# Patient Record
Sex: Female | Born: 1970 | Race: White | Hispanic: No | Marital: Married | Smoking: Current every day smoker
Health system: Southern US, Community
[De-identification: ages and names within clinical notes are randomized; demographics above are authoritative.]

## PROBLEM LIST (undated history)

## (undated) DIAGNOSIS — E119 Type 2 diabetes mellitus without complications: Secondary | ICD-10-CM

## (undated) DIAGNOSIS — Z789 Other specified health status: Secondary | ICD-10-CM

## (undated) DIAGNOSIS — N390 Urinary tract infection, site not specified: Secondary | ICD-10-CM

## (undated) HISTORY — PX: ABDOMINAL HYSTERECTOMY: SHX81

---

## 2005-03-02 ENCOUNTER — Emergency Department: Payer: Self-pay | Admitting: Emergency Medicine

## 2005-04-28 ENCOUNTER — Emergency Department: Payer: Self-pay | Admitting: Emergency Medicine

## 2005-06-19 ENCOUNTER — Emergency Department: Payer: Self-pay | Admitting: Unknown Physician Specialty

## 2005-09-28 ENCOUNTER — Emergency Department: Payer: Self-pay | Admitting: General Practice

## 2006-09-06 ENCOUNTER — Emergency Department: Payer: Self-pay | Admitting: Emergency Medicine

## 2007-07-20 ENCOUNTER — Emergency Department: Payer: Self-pay | Admitting: Emergency Medicine

## 2008-03-11 ENCOUNTER — Emergency Department: Payer: Self-pay | Admitting: Emergency Medicine

## 2008-10-21 ENCOUNTER — Emergency Department: Payer: Self-pay | Admitting: Emergency Medicine

## 2009-03-10 ENCOUNTER — Emergency Department: Payer: Self-pay | Admitting: Emergency Medicine

## 2009-03-13 ENCOUNTER — Emergency Department: Payer: Self-pay | Admitting: Unknown Physician Specialty

## 2009-08-06 ENCOUNTER — Emergency Department: Payer: Self-pay | Admitting: Unknown Physician Specialty

## 2010-05-19 ENCOUNTER — Emergency Department: Payer: Self-pay | Admitting: Emergency Medicine

## 2012-03-16 ENCOUNTER — Emergency Department: Payer: Self-pay | Admitting: Emergency Medicine

## 2012-03-16 LAB — URINALYSIS, COMPLETE
Bilirubin,UR: NEGATIVE
Glucose,UR: NEGATIVE mg/dL (ref 0–75)
Ketone: NEGATIVE
Protein: 100
RBC,UR: 29 /HPF (ref 0–5)
Squamous Epithelial: 10
WBC UR: 1005 /HPF (ref 0–5)

## 2012-03-20 LAB — URINE CULTURE

## 2012-11-18 ENCOUNTER — Emergency Department: Payer: Self-pay | Admitting: Emergency Medicine

## 2012-11-18 LAB — URINALYSIS, COMPLETE
Bilirubin,UR: NEGATIVE
Glucose,UR: NEGATIVE mg/dL (ref 0–75)
Ketone: NEGATIVE
Nitrite: POSITIVE
Ph: 5 (ref 4.5–8.0)
Protein: 100
RBC,UR: NONE SEEN /HPF (ref 0–5)
Specific Gravity: 1.017 (ref 1.003–1.030)
Squamous Epithelial: 14
WBC UR: 19748 /HPF (ref 0–5)

## 2012-11-21 LAB — URINE CULTURE

## 2012-12-26 ENCOUNTER — Emergency Department: Payer: Self-pay | Admitting: Unknown Physician Specialty

## 2012-12-26 LAB — CBC
HCT: 39.1 % (ref 35.0–47.0)
HGB: 13 g/dL (ref 12.0–16.0)
MCH: 30.2 pg (ref 26.0–34.0)
MCV: 91 fL (ref 80–100)
RDW: 14.5 % (ref 11.5–14.5)
WBC: 12.5 10*3/uL — ABNORMAL HIGH (ref 3.6–11.0)

## 2012-12-26 LAB — COMPREHENSIVE METABOLIC PANEL
Anion Gap: 10 (ref 7–16)
BUN: 20 mg/dL — ABNORMAL HIGH (ref 7–18)
Chloride: 105 mmol/L (ref 98–107)
EGFR (African American): >60
Glucose: 125 mg/dL — ABNORMAL HIGH (ref 65–99)
Potassium: 3.9 mmol/L (ref 3.5–5.1)
Sodium: 134 mmol/L — ABNORMAL LOW (ref 136–145)
Total Protein: 8.9 g/dL — ABNORMAL HIGH (ref 6.4–8.2)

## 2013-04-23 ENCOUNTER — Emergency Department: Payer: Self-pay | Admitting: Emergency Medicine

## 2013-04-23 LAB — COMPREHENSIVE METABOLIC PANEL
Albumin: 3.9 g/dL (ref 3.4–5.0)
Anion Gap: 7 (ref 7–16)
BUN: 14 mg/dL (ref 7–18)
Bilirubin,Total: 0.2 mg/dL (ref 0.2–1.0)
Calcium, Total: 9.6 mg/dL (ref 8.5–10.1)
Chloride: 106 mmol/L (ref 98–107)
Co2: 26 mmol/L (ref 21–32)
Potassium: 3.6 mmol/L (ref 3.5–5.1)
SGPT (ALT): 19 U/L (ref 12–78)
Total Protein: 8.2 g/dL (ref 6.4–8.2)

## 2013-04-23 LAB — URINALYSIS, COMPLETE
Glucose,UR: NEGATIVE mg/dL (ref 0–75)
Ketone: NEGATIVE
Ph: 6 (ref 4.5–8.0)
RBC,UR: 381 /HPF (ref 0–5)
Specific Gravity: 1.016 (ref 1.003–1.030)

## 2013-04-23 LAB — CBC
HCT: 39.3 % (ref 35.0–47.0)
MCH: 29.3 pg (ref 26.0–34.0)
MCV: 89 fL (ref 80–100)
RBC: 4.43 10*6/uL (ref 3.80–5.20)
RDW: 14.2 % (ref 11.5–14.5)

## 2013-06-23 ENCOUNTER — Emergency Department: Payer: Self-pay | Admitting: Emergency Medicine

## 2013-06-23 LAB — URINALYSIS, COMPLETE
Bilirubin,UR: NEGATIVE
Ketone: NEGATIVE
Ph: 6 (ref 4.5–8.0)
Protein: 100
RBC,UR: 173 /HPF (ref 0–5)
Specific Gravity: 1.013 (ref 1.003–1.030)
Squamous Epithelial: 33
WBC UR: 5009 /HPF (ref 0–5)

## 2013-06-25 LAB — URINE CULTURE

## 2013-10-18 ENCOUNTER — Emergency Department: Payer: Self-pay | Admitting: Emergency Medicine

## 2013-10-18 LAB — CBC
HCT: 40.9 % (ref 35.0–47.0)
HGB: 13.9 g/dL (ref 12.0–16.0)
MCH: 30.1 pg (ref 26.0–34.0)
MCHC: 33.9 g/dL (ref 32.0–36.0)
MCV: 89 fL (ref 80–100)
Platelet: 282 10*3/uL (ref 150–440)
RBC: 4.61 10*6/uL (ref 3.80–5.20)
RDW: 14.2 % (ref 11.5–14.5)
WBC: 15.1 10*3/uL — ABNORMAL HIGH (ref 3.6–11.0)

## 2013-10-18 LAB — URINALYSIS, COMPLETE
Bacteria: NONE SEEN
Bilirubin,UR: NEGATIVE
Glucose,UR: NEGATIVE mg/dL (ref 0–75)
Ketone: NEGATIVE
Nitrite: NEGATIVE
PH: 7 (ref 4.5–8.0)
Protein: 100
RBC,UR: 81 /HPF (ref 0–5)
Specific Gravity: 1.012 (ref 1.003–1.030)

## 2013-10-18 LAB — COMPREHENSIVE METABOLIC PANEL
ALT: 20 U/L (ref 12–78)
Albumin: 3.4 g/dL (ref 3.4–5.0)
Alkaline Phosphatase: 130 U/L — ABNORMAL HIGH
Anion Gap: 5 — ABNORMAL LOW (ref 7–16)
BUN: 11 mg/dL (ref 7–18)
Bilirubin,Total: 0.3 mg/dL (ref 0.2–1.0)
Calcium, Total: 9.1 mg/dL (ref 8.5–10.1)
Chloride: 105 mmol/L (ref 98–107)
Co2: 25 mmol/L (ref 21–32)
Creatinine: 0.74 mg/dL (ref 0.60–1.30)
EGFR (African American): >60
EGFR (Non-African Amer.): >60
Glucose: 155 mg/dL — ABNORMAL HIGH (ref 65–99)
OSMOLALITY: 273 (ref 275–301)
Potassium: 3.4 mmol/L — ABNORMAL LOW (ref 3.5–5.1)
SGOT(AST): 13 U/L — ABNORMAL LOW (ref 15–37)
SODIUM: 135 mmol/L — AB (ref 136–145)
Total Protein: 7.8 g/dL (ref 6.4–8.2)

## 2013-10-20 LAB — URINE CULTURE

## 2013-12-27 ENCOUNTER — Emergency Department: Payer: Self-pay | Admitting: Internal Medicine

## 2013-12-27 LAB — URINALYSIS, COMPLETE
BILIRUBIN, UR: NEGATIVE
Glucose,UR: NEGATIVE mg/dL (ref 0–75)
Ketone: NEGATIVE
NITRITE: POSITIVE
Ph: 6 (ref 4.5–8.0)
Specific Gravity: 1.01 (ref 1.003–1.030)
Squamous Epithelial: 4
WBC UR: 2369 /HPF (ref 0–5)

## 2013-12-27 LAB — GC/CHLAMYDIA PROBE AMP

## 2013-12-27 LAB — WET PREP, GENITAL

## 2013-12-29 LAB — URINE CULTURE

## 2014-05-18 ENCOUNTER — Emergency Department: Payer: Self-pay | Admitting: Emergency Medicine

## 2014-05-18 LAB — URINALYSIS, COMPLETE
BLOOD: NEGATIVE
Bilirubin,UR: NEGATIVE
Glucose,UR: NEGATIVE mg/dL (ref 0–75)
Nitrite: NEGATIVE
Ph: 7 (ref 4.5–8.0)
SQUAMOUS EPITHELIAL: NONE SEEN
Specific Gravity: 1.015 (ref 1.003–1.030)

## 2014-07-09 ENCOUNTER — Emergency Department: Payer: Self-pay | Admitting: Emergency Medicine

## 2014-07-09 LAB — CBC WITH DIFFERENTIAL/PLATELET
BASOS PCT: 0.9 %
Basophil #: 0.1 10*3/uL (ref 0.0–0.1)
EOS PCT: 0 %
Eosinophil #: 0 10*3/uL (ref 0.0–0.7)
HCT: 41.2 % (ref 35.0–47.0)
HGB: 13.1 g/dL (ref 12.0–16.0)
LYMPHS PCT: 8.7 %
Lymphocyte #: 1.4 10*3/uL (ref 1.0–3.6)
MCH: 29.5 pg (ref 26.0–34.0)
MCHC: 31.7 g/dL — ABNORMAL LOW (ref 32.0–36.0)
MCV: 93 fL (ref 80–100)
MONO ABS: 1 x10 3/mm — AB (ref 0.2–0.9)
Monocyte %: 6.3 %
NEUTROS PCT: 84.1 %
Neutrophil #: 13.9 10*3/uL — ABNORMAL HIGH (ref 1.4–6.5)
PLATELETS: 271 10*3/uL (ref 150–440)
RBC: 4.42 10*6/uL (ref 3.80–5.20)
RDW: 13.9 % (ref 11.5–14.5)
WBC: 16.6 10*3/uL — ABNORMAL HIGH (ref 3.6–11.0)

## 2014-07-09 LAB — URINALYSIS, COMPLETE
Bilirubin,UR: NEGATIVE
Glucose,UR: NEGATIVE mg/dL (ref 0–75)
Ketone: NEGATIVE
Nitrite: NEGATIVE
PH: 7 (ref 4.5–8.0)
Protein: >=500
RBC,UR: 906 /HPF (ref 0–5)
Specific Gravity: 1.016 (ref 1.003–1.030)
Squamous Epithelial: 6
Transitional Epi: 6
WBC UR: 11808 /HPF (ref 0–5)

## 2014-07-09 LAB — COMPREHENSIVE METABOLIC PANEL
ALBUMIN: 3.7 g/dL (ref 3.4–5.0)
ANION GAP: 7 (ref 7–16)
Alkaline Phosphatase: 132 U/L — ABNORMAL HIGH
BUN: 13 mg/dL (ref 7–18)
Bilirubin,Total: 0.2 mg/dL (ref 0.2–1.0)
CALCIUM: 9.1 mg/dL (ref 8.5–10.1)
CO2: 25 mmol/L (ref 21–32)
Chloride: 109 mmol/L — ABNORMAL HIGH (ref 98–107)
Creatinine: 0.73 mg/dL (ref 0.60–1.30)
Glucose: 130 mg/dL — ABNORMAL HIGH (ref 65–99)
Osmolality: 283 (ref 275–301)
Potassium: 4 mmol/L (ref 3.5–5.1)
SGOT(AST): 15 U/L (ref 15–37)
SGPT (ALT): 18 U/L
Sodium: 141 mmol/L (ref 136–145)
TOTAL PROTEIN: 7.9 g/dL (ref 6.4–8.2)

## 2015-01-21 ENCOUNTER — Emergency Department
Admit: 2015-01-21 | Disposition: A | Discharge status: Home / Self Care | Payer: Self-pay | Admitting: Emergency Medicine

## 2015-01-21 LAB — COMPREHENSIVE METABOLIC PANEL
ALBUMIN: 3.9 g/dL
ANION GAP: 10 (ref 7–16)
AST: 16 U/L
Alkaline Phosphatase: 118 U/L
BUN: 12 mg/dL
Bilirubin,Total: 0.3 mg/dL
CALCIUM: 9.2 mg/dL
CHLORIDE: 101 mmol/L
CO2: 27 mmol/L
Creatinine: 0.66 mg/dL
GLUCOSE: 140 mg/dL — AB
Potassium: 3.3 mmol/L — ABNORMAL LOW
SGPT (ALT): 13 U/L — ABNORMAL LOW
Sodium: 138 mmol/L
Total Protein: 7.9 g/dL

## 2015-01-21 LAB — CBC WITH DIFFERENTIAL/PLATELET
BASOS ABS: 0.1 10*3/uL (ref 0.0–0.1)
Basophil %: 1.3 %
Eosinophil #: 0.1 10*3/uL (ref 0.0–0.7)
Eosinophil %: 0.7 %
HCT: 38.1 % (ref 35.0–47.0)
HGB: 12.4 g/dL (ref 12.0–16.0)
LYMPHS ABS: 3.3 10*3/uL (ref 1.0–3.6)
LYMPHS PCT: 29.4 %
MCH: 29.5 pg (ref 26.0–34.0)
MCHC: 32.7 g/dL (ref 32.0–36.0)
MCV: 90 fL (ref 80–100)
Monocyte #: 0.8 x10 3/mm (ref 0.2–0.9)
Monocyte %: 7.3 %
Neutrophil #: 6.8 10*3/uL — ABNORMAL HIGH (ref 1.4–6.5)
Neutrophil %: 61.3 %
PLATELETS: 292 10*3/uL (ref 150–440)
RBC: 4.22 10*6/uL (ref 3.80–5.20)
RDW: 14.3 % (ref 11.5–14.5)
WBC: 11 10*3/uL (ref 3.6–11.0)

## 2015-01-21 LAB — URINALYSIS, COMPLETE
BILIRUBIN, UR: NEGATIVE
GLUCOSE, UR: NEGATIVE mg/dL (ref 0–75)
Ketone: NEGATIVE
Nitrite: NEGATIVE
Ph: 6 (ref 4.5–8.0)
Protein: 100
SQUAMOUS EPITHELIAL: NONE SEEN
Specific Gravity: 1.015 (ref 1.003–1.030)

## 2015-09-30 ENCOUNTER — Emergency Department
Admission: EM | Admit: 2015-09-30 | Discharge: 2015-09-30 | Disposition: A | Discharge status: Home / Self Care | Payer: Commercial Managed Care - HMO | Attending: Emergency Medicine | Admitting: Emergency Medicine

## 2015-09-30 DIAGNOSIS — N390 Urinary tract infection, site not specified: Secondary | ICD-10-CM

## 2015-09-30 DIAGNOSIS — H6501 Acute serous otitis media, right ear: Secondary | ICD-10-CM | POA: Diagnosis not present

## 2015-09-30 DIAGNOSIS — R350 Frequency of micturition: Secondary | ICD-10-CM | POA: Diagnosis present

## 2015-09-30 LAB — URINALYSIS COMPLETE WITH MICROSCOPIC (ARMC ONLY)
BILIRUBIN URINE: NEGATIVE
GLUCOSE, UA: NEGATIVE mg/dL
Ketones, ur: NEGATIVE mg/dL
NITRITE: NEGATIVE
Protein, ur: 100 mg/dL — AB
Specific Gravity, Urine: 1.017 (ref 1.005–1.030)
pH: 6 (ref 5.0–8.0)

## 2015-09-30 MED ORDER — CIPROFLOXACIN HCL 500 MG PO TABS: 500.0000 mg | ORAL_TABLET | Freq: Two times a day (BID) | ORAL | Status: DC

## 2015-09-30 MED ORDER — CETIRIZINE HCL 10 MG PO TABS: 10.0000 mg | ORAL_TABLET | Freq: Every day | ORAL | Status: DC

## 2015-09-30 MED ORDER — PHENAZOPYRIDINE HCL 95 MG PO TABS: 95.0000 mg | ORAL_TABLET | Freq: Three times a day (TID) | ORAL | Status: DC | PRN

## 2015-09-30 MED ORDER — FLUTICASONE PROPIONATE 50 MCG/ACT NA SUSP: 1.0000 | Freq: Two times a day (BID) | NASAL | Status: DC

## 2015-09-30 NOTE — Discharge Instructions (Signed)
Antibiotic Medicine °Antibiotic medicines are used to treat infections caused by bacteria. They work by injuring or killing the bacteria that is making you sick. °HOW IS AN ANTIBIOTIC CHOSEN? °An antibiotic is chosen based on many factors. To help your health care provider choose one for you, tell your health care provider if: °· You have any allergies. °· You are pregnant or plan to get pregnant. °· You are breastfeeding. °· You are taking any medicines. These include over-the-counter medicines, prescription medicines, and herbal remedies. °· You have a medical condition or problem you have not already discussed. °Your health care provider will also consider: °· How often the medicine has to be taken. °· Common side effects of the medicine. °· The cost of the medicine. °· The taste of the medicine. °If you have questions about why an antibiotic was chosen, make sure to ask. °FOR HOW LONG SHOULD I TAKE MY ANTIBIOTIC? °Continue to take your antibiotic for as long as told by your health care provider. Do not stop taking it when you feel better. If you stop taking it too soon: °· You may start to feel sick again. °· Your infection may become harder to treat. °· Complications may develop. °WHAT IF I MISS A DOSE? °Try not to miss any doses of medicine. If you miss a dose, take it as soon as possible. However, if it is almost time for the next dose: °· If you are taking 2 doses per day, take the missed dose and the next dose 5 to 6 hours apart. °· If you are taking 3 or more doses per day, take the missed dose and the next dose 2 to 4 hours apart, then go back to the normal schedule. °If you cannot make up a missed dose, take the next scheduled dose on time. Then take the missed dose after you have taken all the doses as recommended by your health care provider, as if you had one more dose left. °DO ANTIBIOTICS AFFECT BIRTH CONTROL? °Birth control pills may not work while you are on antibiotics. If you are taking birth  control pills, continue taking them as usual and use a second form of birth control, such as a condom, to avoid unwanted pregnancy. Continue using the second form of birth control until you are finished with your current 1 month cycle of birth control pills. °OTHER INFORMATION °· If there is any medicine left over, throw it away. °· Never take someone else's antibiotics. °· Never take leftover antibiotics. °SEEK MEDICAL CARE IF: °· You get worse. °· You do not feel better within a few days of starting the antibiotic medicine. °· You vomit. °· White patches appear in your mouth. °· You have new joint pain that begins after starting the antibiotic. °· You have new muscle aches that begin after starting the antibiotic. °· You had a fever before starting the antibiotic and it returns. °· You have any symptoms of an allergic reaction, such as an itchy rash. If this happens, stop taking the antibiotic. °SEEK IMMEDIATE MEDICAL CARE IF: °· Your urine turns dark or becomes blood-colored. °· Your skin turns yellow. °· You bruise or bleed easily. °· You have severe diarrhea and abdominal cramps. °· You have a severe headache. °· You have signs of a severe allergic reaction, such as: °¨ Trouble breathing. °¨ Wheezing. °¨ Swelling of the lips, tongue, or face. °¨ Fainting. °¨ Blisters on the skin or in the mouth. °If you have signs of a severe allergic   reaction, stop taking the antibiotic right away.   This information is not intended to replace advice given to you by your health care provider. Make sure you discuss any questions you have with your health care provider.   Document Released: 05/30/2004 Document Revised: 06/08/2015 Document Reviewed: 02/02/2015 Elsevier Interactive Patient Education 2016 [employer]  Otitis Media, Adult Otitis media is redness, soreness, and inflammation of the middle ear. Otitis media may be caused by allergies or, most commonly, by infection. Often it occurs as a complication of the  common cold. SIGNS AND SYMPTOMS Symptoms of otitis media may include:  Earache.  Fever.  Ringing in your ear.  Headache.  Leakage of fluid from the ear. DIAGNOSIS To diagnose otitis media, your health care provider will examine your ear with an otoscope. This is an instrument that allows your health care provider to see into your ear in order to examine your eardrum. Your health care provider also will ask you questions about your symptoms. TREATMENT  Typically, otitis media resolves on its own within 3-5 days. Your health care provider may prescribe medicine to ease your symptoms of pain. If otitis media does not resolve within 5 days or is recurrent, your health care provider may prescribe antibiotic medicines if he or she suspects that a bacterial infection is the cause. HOME CARE INSTRUCTIONS   If you were prescribed an antibiotic medicine, finish it all even if you start to feel better.  Take medicines only as directed by your health care provider.  Keep all follow-up visits as directed by your health care provider. SEEK MEDICAL CARE IF:  You have otitis media only in one ear, or bleeding from your nose, or both.  You notice a lump on your neck.  You are not getting better in 3-5 days.  You feel worse instead of better. SEEK IMMEDIATE MEDICAL CARE IF:   You have pain that is not controlled with medicine.  You have swelling, redness, or pain around your ear or stiffness in your neck.  You notice that part of your face is paralyzed.  You notice that the bone behind your ear (mastoid) is tender when you touch it. MAKE SURE YOU:   Understand these instructions.  Will watch your condition.  Will get help right away if you are not doing well or get worse.   This information is not intended to replace advice given to you by your health care provider. Make sure you discuss any questions you have with your health care provider.   Document Released: 06/22/2004 Document  Revised: 10/08/2014 Document Reviewed: 04/14/2013 Elsevier Interactive Patient Education 2016 [employer]  Urinary Tract Infection Urinary tract infections (UTIs) can develop anywhere along your urinary tract. Your urinary tract is your body's drainage system for removing wastes and extra water. Your urinary tract includes two kidneys, two ureters, a bladder, and a urethra. Your kidneys are a pair of bean-shaped organs. Each kidney is about the size of your fist. They are located below your ribs, one on each side of your spine. CAUSES Infections are caused by microbes, which are microscopic organisms, including fungi, viruses, and bacteria. These organisms are so small that they can only be seen through a microscope. Bacteria are the microbes that most commonly cause UTIs. SYMPTOMS  Symptoms of UTIs may vary by age and gender of the patient and by the location of the infection. Symptoms in young women typically include a frequent and intense urge to urinate and a painful, burning feeling  in the bladder or urethra during urination. Older women and men are more likely to be tired, shaky, and weak and have muscle aches and abdominal pain. A fever may mean the infection is in your kidneys. Other symptoms of a kidney infection include pain in your back or sides below the ribs, nausea, and vomiting. DIAGNOSIS To diagnose a UTI, your caregiver will ask you about your symptoms. Your caregiver will also ask you to provide a urine sample. The urine sample will be tested for bacteria and white blood cells. White blood cells are made by your body to help fight infection. TREATMENT  Typically, UTIs can be treated with medication. Because most UTIs are caused by a bacterial infection, they usually can be treated with the use of antibiotics. The choice of antibiotic and length of treatment depend on your symptoms and the type of bacteria causing your infection. HOME CARE INSTRUCTIONS  If you were prescribed  antibiotics, take them exactly as your caregiver instructs you. Finish the medication even if you feel better after you have only taken some of the medication.  Drink enough water and fluids to keep your urine clear or pale yellow.  Avoid caffeine, tea, and carbonated beverages. They tend to irritate your bladder.  Empty your bladder often. Avoid holding urine for long periods of time.  Empty your bladder before and after sexual intercourse.  After a bowel movement, women should cleanse from front to back. Use each tissue only once. SEEK MEDICAL CARE IF:   You have back pain.  You develop a fever.  Your symptoms do not begin to resolve within 3 days. SEEK IMMEDIATE MEDICAL CARE IF:   You have severe back pain or lower abdominal pain.  You develop chills.  You have nausea or vomiting.  You have continued burning or discomfort with urination. MAKE SURE YOU:   Understand these instructions.  Will watch your condition.  Will get help right away if you are not doing well or get worse.   This information is not intended to replace advice given to you by your health care provider. Make sure you discuss any questions you have with your health care provider.   Document Released: 06/27/2005 Document Revised: 06/08/2015 Document Reviewed: 10/26/2011 Elsevier Interactive Patient Education [employer]

## 2015-09-30 NOTE — ED Provider Notes (Signed)
[facility] Emergency Department Provider Note ?  ? ____________________________________________ ? Time seen: 8:11 PM ? I have reviewed the triage vital signs and the nursing notes.  ________ HISTORY ? Chief Complaint Otalgia and Urinary Frequency     HPI  Susan Munoz is a 44 y.o. female   who presents emergency department complaining of 2 to three-week history of dysuria, polyuria, as well as a total day history of nasal congestion and right ear pain. Per the patient she has had multiple UTIs in the past and states the symptoms are consistent. She denies any flank pain. She denies any hematuria. Patient denies any sinus pressure but she does endorse some clear rhinorrhea as well as right-sided ear pain. ? ? ? No past medical history on file.  There are no active problems to display for this patient.  ? No past surgical history on file. ? Current Outpatient Rx  Name  Route  Sig  Dispense  Refill  . cetirizine (ZYRTEC) 10 MG tablet   Oral   Take 1 tablet (10 mg total) by mouth daily.   30 tablet   0   . ciprofloxacin (CIPRO) 500 MG tablet   Oral   Take 1 tablet (500 mg total) by mouth 2 (two) times daily.   14 tablet   0   . fluticasone (FLONASE) 50 MCG/ACT nasal spray   Each Nare   Place 1 spray into both nostrils 2 (two) times daily.   16 g   0   . phenazopyridine (PYRIDIUM) 95 MG tablet   Oral   Take 1 tablet (95 mg total) by mouth 3 (three) times daily as needed for pain.   6 tablet   0    ? Allergies Sulfa antibiotics ? No family history on file. ? Social History Social History  Substance Use Topics  . Smoking status: Not on file  . Smokeless tobacco: Not on file  . Alcohol Use: Not on file   ? Review of Systems Constitutional: no fever. Eyes: no discharge ENT: no sore throat. Patient endorses right-sided ear pain. Cardiovascular: no chest pain. Respiratory: no cough. No sob Gastrointestinal: denies abdominal  pain, vomiting, diarrhea, and constipation Genitourinary: Positive for dysuria and polyuria.. Negative for hematuria Musculoskeletal: Negative for back pain. Skin: Negative for rash. Neurological: Negative for headaches  10-point ROS otherwise negative.  _______________ PHYSICAL EXAM: ? VITAL SIGNS:   ED Triage Vitals  Enc Vitals Group     BP 09/30/15 1911 125/92 mmHg     Pulse --      Resp --      Temp 09/30/15 1911 98 F (36.7 C)     Temp Source 09/30/15 1911 Oral     SpO2 --      Weight --      Height --      Head Cir --      Peak Flow --      Pain Score 09/30/15 1911 5     Pain Loc --      Pain Edu? --      Excl. in GC? --    ?  Constitutional: Alert and oriented. Well appearing and in no distress. Eyes: Conjunctivae are normal.  ENT      Head: Normocephalic and atraumatic.      Ears: EACs are unremarkable bilaterally. TM on left is unremarkable. TM on right is visualized as dusky in appearance, moderately bulging, with air-fluid level.      Nose: Moderate  clear congestion/rhinnorhea.      Mouth/Throat: Mucous membranes are moist.   Hematological/Lymphatic/Immunilogical: No cervical lymphadenopathy. Cardiovascular: Normal rate, regular rhythm. Normal S1 and S2. Respiratory: Normal respiratory effort without tachypnea nor retractions. Lungs CTAB. Gastrointestinal: Soft and nontender. All sounds 4 quadrants. No guarding. No rigidity. No distention. There is no CVA tenderness. Genitourinary:  Musculoskeletal: Nontender with normal range of motion in all extremities.  Neurologic:  Normal speech and language. No gross focal neurologic deficits are appreciated. Skin:  Skin is warm, dry and intact. No rash noted. Psychiatric: Mood and affect are normal. Speech and behavior are normal. Patient exhibits appropriate insight and judgment.    LABS (all labs ordered are listed, but only abnormal results are displayed)  Labs Reviewed  URINALYSIS COMPLETEWITH MICROSCOPIC  ([facility] ONLY) - Abnormal; Notable for the following:    Color, Urine YELLOW (*)    APPearance TURBID (*)    Hgb urine dipstick 2+ (*)    Protein, ur 100 (*)    Leukocytes, UA 3+ (*)    Bacteria, UA MANY (*)    Squamous Epithelial / LPF 6-30 (*)    All other components within normal limits    ___________ RADIOLOGY    _____________ PROCEDURES ? Procedure(s) performed:    Medications - No data to display  ______________________________________________________ INITIAL IMPRESSION / ASSESSMENT AND PLAN / ED COURSE ? Pertinent labs & imaging results that were available during my care of the patient were reviewed by me and considered in my medical decision making (see chart for details).   His diagnosis is consistent with right-sided otitis media and urinary tract infection. Patient will be given antibiotics, Flonase, Zyrtec, Pyridium for symptom control. Patient is to follow-up with her primary care provider should symptoms persist past treatment course. Patient verbalizes understanding of diagnosis and verbalize compliance with the plan.     New Prescriptions   CETIRIZINE (ZYRTEC) 10 MG TABLET    Take 1 tablet (10 mg total) by mouth daily.   CIPROFLOXACIN (CIPRO) 500 MG TABLET    Take 1 tablet (500 mg total) by mouth 2 (two) times daily.   FLUTICASONE (FLONASE) 50 MCG/ACT NASAL SPRAY    Place 1 spray into both nostrils 2 (two) times daily.   PHENAZOPYRIDINE (PYRIDIUM) 95 MG TABLET    Take 1 tablet (95 mg total) by mouth 3 (three) times daily as needed for pain.   ____________________________________________ FINAL CLINICAL IMPRESSION(S) / ED DIAGNOSES?  Final diagnoses:  UTI (lower urinary tract infection)  Right acute serous otitis media, recurrence not specified            Racheal PatchesJonathan D Cuthriell, PA-C 09/30/15 2012  Phineas SemenGraydon Goodman, MD 09/30/15 2211

## 2015-09-30 NOTE — ED Notes (Signed)
AAOx3.  Skin warm and dry.  NAD.  D/C home 

## 2015-09-30 NOTE — ED Notes (Signed)
Pt reports several weeks of urinary frequency with pain and burning with urination. Pt also reports congestion for about 2 days and now having pain to her right ear.

## 2016-11-06 ENCOUNTER — Emergency Department
Admission: EM | Admit: 2016-11-06 | Discharge: 2016-11-06 | Disposition: A | Discharge status: Home / Self Care | Payer: Commercial Managed Care - HMO | Attending: Emergency Medicine | Admitting: Emergency Medicine

## 2016-11-06 ENCOUNTER — Encounter: Payer: Self-pay | Admitting: *Deleted

## 2016-11-06 DIAGNOSIS — N3001 Acute cystitis with hematuria: Secondary | ICD-10-CM | POA: Insufficient documentation

## 2016-11-06 DIAGNOSIS — F17200 Nicotine dependence, unspecified, uncomplicated: Secondary | ICD-10-CM | POA: Insufficient documentation

## 2016-11-06 DIAGNOSIS — E119 Type 2 diabetes mellitus without complications: Secondary | ICD-10-CM | POA: Insufficient documentation

## 2016-11-06 DIAGNOSIS — H9211 Otorrhea, right ear: Secondary | ICD-10-CM

## 2016-11-06 HISTORY — DX: Other specified health status: Z78.9

## 2016-11-06 HISTORY — DX: Type 2 diabetes mellitus without complications: E11.9

## 2016-11-06 LAB — URINALYSIS, COMPLETE (UACMP) WITH MICROSCOPIC
BILIRUBIN URINE: NEGATIVE
GLUCOSE, UA: NEGATIVE mg/dL
Ketones, ur: NEGATIVE mg/dL
Nitrite: POSITIVE — AB
PROTEIN: 100 mg/dL — AB
Specific Gravity, Urine: 1.008 (ref 1.005–1.030)
pH: 6 (ref 5.0–8.0)

## 2016-11-06 MED ORDER — DOXYCYCLINE HYCLATE 100 MG PO TABS
100.0000 mg | ORAL_TABLET | Freq: Once | ORAL | Status: AC
  Administered 2016-11-06: 100 mg via ORAL
  Filled 2016-11-06: qty 1

## 2016-11-06 MED ORDER — CIPROFLOXACIN HCL 0.3 % OP SOLN: 2.0000 [drp] | OPHTHALMIC | 0 refills | Status: DC

## 2016-11-06 MED ORDER — CIPROFLOXACIN HCL 0.3 % OP SOLN
2.0000 [drp] | OPHTHALMIC | Status: DC
  Administered 2016-11-06: 2 [drp] via OTIC
  Filled 2016-11-06: qty 2.5

## 2016-11-06 MED ORDER — DOXYCYCLINE HYCLATE 100 MG PO CAPS: 100.0000 mg | ORAL_CAPSULE | Freq: Two times a day (BID) | ORAL | 0 refills | Status: DC

## 2016-11-06 NOTE — Discharge Instructions (Signed)
Call the [facility] in the morning. Return to the ER for symptoms that change or worsen or for new concerns if you are unable to schedule an appointment.

## 2016-11-06 NOTE — ED Provider Notes (Signed)
[facility] Emergency Department Provider Note  ____________________________________________  Time seen: Approximately 9:29 PM  I have reviewed the triage vital signs and the nursing notes.   HISTORY  Chief Complaint Dysuria    HPI Susan Munoz is a 46 y.o. female who presents to the emergency department for evaluation of dysuria and frequency for the past couple of weeks. She has to self cath and gets frequent UTIs. She has also had draining and pain in the right ear for the past few days. She denies fever.  Past Medical History:  Diagnosis Date  . Diabetes mellitus without complication (HCC)   . Self-catheterizes urinary bladder     There are no active problems to display for this patient.   Past Surgical History:  Procedure Laterality Date  . ABDOMINAL HYSTERECTOMY      Prior to Admission medications   Medication Sig Start Date End Date Taking? Authorizing Provider  cetirizine (ZYRTEC) 10 MG tablet Take 1 tablet (10 mg total) by mouth daily. 09/30/15   Delorise RoyalsJonathan D Cuthriell, PA-C  ciprofloxacin (CILOXAN) 0.3 % ophthalmic solution Place 2 drops into the right ear every 4 (four) hours while awake. 11/06/16   Chinita Pesterari B Brodi Kari, FNP  ciprofloxacin (CIPRO) 500 MG tablet Take 1 tablet (500 mg total) by mouth 2 (two) times daily. 09/30/15   Delorise RoyalsJonathan D Cuthriell, PA-C  doxycycline (VIBRAMYCIN) 100 MG capsule Take 1 capsule (100 mg total) by mouth 2 (two) times daily. 11/06/16   Chinita Pesterari B Keylah Darwish, FNP  fluticasone (FLONASE) 50 MCG/ACT nasal spray Place 1 spray into both nostrils 2 (two) times daily. 09/30/15   Delorise RoyalsJonathan D Cuthriell, PA-C  phenazopyridine (PYRIDIUM) 95 MG tablet Take 1 tablet (95 mg total) by mouth 3 (three) times daily as needed for pain. 09/30/15   Delorise RoyalsJonathan D Cuthriell, PA-C    Allergies Sulfa antibiotics  No family history on file.  Social History Social History  Substance Use Topics  . Smoking status: Current Every Day Smoker    Packs/day:  0.50  . Smokeless tobacco: Never Used  . Alcohol use No    Review of Systems Constitutional: Negative for fever. ENT: Positive for otalgia and otorrhea. Respiratory: Negative for shortness of breath or cough. Gastrointestinal: Positive for abdominal pain; negative for nausea , negative for vomiting. Genitourinary: Positive for dysuria , negative for vaginal discharge. Musculoskeletal: Negative for back pain. Skin: negative for rash. ____________________________________________   PHYSICAL EXAM:  VITAL SIGNS: ED Triage Vitals  Enc Vitals Group     BP 11/06/16 2121 (!) 154/104     Pulse Rate 11/06/16 2121 82     Resp 11/06/16 2121 16     Temp 11/06/16 2121 98 F (36.7 C)     Temp Source 11/06/16 2121 Oral     SpO2 11/06/16 2121 100 %     Weight 11/06/16 2122 145 lb (65.8 kg)     Height 11/06/16 2122 5' (1.524 m)     Head Circumference --      Peak Flow --      Pain Score 11/06/16 2122 8     Pain Loc --      Pain Edu? --      Excl. in GC? --     Constitutional: Alert and oriented. Well appearing and in no acute distress. Eyes: Conjunctivae are normal. PERRL. EOMI. Head: Atraumatic. Nose: No congestion/rhinnorhea. Mouth/Throat: Mucous membranes are moist. Respiratory: Normal respiratory effort.  No retractions. Gastrointestinal: Suprapubic tenderness on exam; bowel sounds present x 4  quadrants; mobile, nodular structures felt at the suprapubic region. Genitourinary: Pelvic exam: Not indicated. Musculoskeletal: No extremity tenderness nor edema.  Neurologic:  Normal speech and language. No gross focal neurologic deficits are appreciated. Speech is normal. No gait instability. Skin:  Skin is warm, dry and intact. No rash noted. Psychiatric: Mood and affect are normal. Speech and behavior are normal.  ____________________________________________   LABS (all labs ordered are listed, but only abnormal results are displayed)  Labs Reviewed  URINALYSIS, COMPLETE (UACMP)  WITH MICROSCOPIC - Abnormal; Notable for the following:       Result Value   Color, Urine YELLOW (*)    APPearance TURBID (*)    Hgb urine dipstick MODERATE (*)    Protein, ur 100 (*)    Nitrite POSITIVE (*)    Leukocytes, UA LARGE (*)    Bacteria, UA MANY (*)    Squamous Epithelial / LPF 0-5 (*)    All other components within normal limits   ____________________________________________  RADIOLOGY  Not indicated. ____________________________________________   PROCEDURES  Procedure(s) performed: none  ____________________________________________   INITIAL IMPRESSION / ASSESSMENT AND PLAN / ED COURSE      Pertinent labs & imaging results that were available during my care of the patient were reviewed by me and considered in my medical decision making (see chart for details).  46 year old female presenting to the emergency department for multiple medical complaints including dysuria and right ear pain. She has a sulfa allergy, therefore she will be treated with doxycycline and the first dose will be given tonight. Patient states that she does not have any healthcare insurance and therefore does not have a primary care provider. She was given an application for the medication management clinic and instructed to call tomorrow for further details. She was instructed to follow-up with the primary care provider once established if the nodular structures in the lower abdomen did not resolve after the urinary tract infection has been treated. She was advised to return to emergency department for symptoms that change or worsen if she is unable to schedule an appointment. ____________________________________________   FINAL CLINICAL IMPRESSION(S) / ED DIAGNOSES  Final diagnoses:  Acute cystitis with hematuria  Otorrhea of right ear    Note:  This document was prepared using Dragon voice recognition software and may include unintentional dictation errors.    Chinita Pester,  FNP 11/06/16 2251    Myrna Blazer, MD 11/06/16 [phone]

## 2016-11-06 NOTE — ED Notes (Signed)
Pt reports rt ear pain and drainage x2 months. Nasal congestion x 1 month. Pt. Reports right sided pelvic pain, "feels a lump"

## 2016-11-06 NOTE — ED Triage Notes (Signed)
Pt reports burning and frequency on and off for the past couple weeks. Pt reports having frequent UTI's due to self catheterization. Pt reports having cloudy urine at this time as well. Pt reports having been red but denies having taken temperature  at home.

## 2016-12-12 ENCOUNTER — Emergency Department
Admission: EM | Admit: 2016-12-12 | Discharge: 2016-12-12 | Disposition: A | Discharge status: Home / Self Care | Payer: Commercial Managed Care - HMO | Attending: Emergency Medicine | Admitting: Emergency Medicine

## 2016-12-12 DIAGNOSIS — N12 Tubulo-interstitial nephritis, not specified as acute or chronic: Secondary | ICD-10-CM

## 2016-12-12 DIAGNOSIS — F17200 Nicotine dependence, unspecified, uncomplicated: Secondary | ICD-10-CM | POA: Insufficient documentation

## 2016-12-12 DIAGNOSIS — Z79899 Other long term (current) drug therapy: Secondary | ICD-10-CM | POA: Insufficient documentation

## 2016-12-12 DIAGNOSIS — E119 Type 2 diabetes mellitus without complications: Secondary | ICD-10-CM | POA: Insufficient documentation

## 2016-12-12 DIAGNOSIS — R509 Fever, unspecified: Secondary | ICD-10-CM | POA: Insufficient documentation

## 2016-12-12 LAB — URINALYSIS, COMPLETE (UACMP) WITH MICROSCOPIC
BACTERIA UA: NONE SEEN
BILIRUBIN URINE: NEGATIVE
Glucose, UA: NEGATIVE mg/dL
Hgb urine dipstick: NEGATIVE
KETONES UR: NEGATIVE mg/dL
Nitrite: POSITIVE — AB
Protein, ur: >=300 mg/dL — AB
SQUAMOUS EPITHELIAL / LPF: NONE SEEN
Specific Gravity, Urine: 1.017 (ref 1.005–1.030)
pH: 8 (ref 5.0–8.0)

## 2016-12-12 LAB — CBC
HEMATOCRIT: 34 % — AB (ref 35.0–47.0)
Hemoglobin: 11.6 g/dL — ABNORMAL LOW (ref 12.0–16.0)
MCH: 30.6 pg (ref 26.0–34.0)
MCHC: 34 g/dL (ref 32.0–36.0)
MCV: 89.7 fL (ref 80.0–100.0)
PLATELETS: 252 10*3/uL (ref 150–440)
RBC: 3.78 MIL/uL — ABNORMAL LOW (ref 3.80–5.20)
RDW: 16.4 % — AB (ref 11.5–14.5)
WBC: 9.3 10*3/uL (ref 3.6–11.0)

## 2016-12-12 LAB — BASIC METABOLIC PANEL
Anion gap: 10 (ref 5–15)
BUN: 19 mg/dL (ref 6–20)
CALCIUM: 9 mg/dL (ref 8.9–10.3)
CO2: 20 mmol/L — AB (ref 22–32)
Chloride: 103 mmol/L (ref 101–111)
Creatinine, Ser: 0.91 mg/dL (ref 0.44–1.00)
GFR calc Af Amer: >60 mL/min (ref >60)
GFR calc non Af Amer: >60 mL/min (ref >60)
GLUCOSE: 131 mg/dL — AB (ref 65–99)
Potassium: 3.5 mmol/L (ref 3.5–5.1)
Sodium: 133 mmol/L — ABNORMAL LOW (ref 135–145)

## 2016-12-12 MED ORDER — ACETAMINOPHEN 500 MG PO TABS
1000.0000 mg | ORAL_TABLET | Freq: Once | ORAL | Status: AC
  Administered 2016-12-12: 1000 mg via ORAL
  Filled 2016-12-12: qty 2

## 2016-12-12 MED ORDER — SODIUM CHLORIDE 0.9 % IV BOLUS (SEPSIS)
1000.0000 mL | Freq: Once | INTRAVENOUS | Status: AC
  Administered 2016-12-12: 1000 mL via INTRAVENOUS

## 2016-12-12 MED ORDER — LEVOFLOXACIN 500 MG PO TABS: 500.0000 mg | ORAL_TABLET | Freq: Every day | ORAL | 0 refills | Status: AC

## 2016-12-12 MED ORDER — LEVOFLOXACIN 500 MG PO TABS
500.0000 mg | ORAL_TABLET | Freq: Once | ORAL | Status: AC
  Administered 2016-12-12: 500 mg via ORAL
  Filled 2016-12-12: qty 1

## 2016-12-12 NOTE — Discharge Instructions (Signed)
Please take antibiotics as prescribed. Drink lots of fluids. Return to the ER for any worsening fevers increase in back pain and vomiting or any urgent changes in her health.

## 2016-12-12 NOTE — ED Triage Notes (Addendum)
Pt states "I'm dying." pt states flank pain, urinary infection, chills, high fever, gagging "because I havent eaten anything." Pt is tachypneic, alert, oriented. Pt self caths.

## 2016-12-12 NOTE — ED Provider Notes (Signed)
[facility]-EMERGENCY DEPARTMENT Provider Note   CSN: [id number] Arrival date & time: 12/12/16  1625     History   Chief Complaint Chief Complaint  Patient presents with  . Recurrent UTI    HPI Susan Munoz is a 46 y.o. female presents to the emergency department for evaluation of recurrent urinary tract infection. Patient was treated less than one month ago for UTI with Cipro. Did not see complete resolution of her symptoms. Over the last week she said increasing pain in her left lower back with urinary pressure. She's had intermittent fevers with episodes of nausea and 2 episodes of vomiting over the last week. Patient states she uses a self catheter to help with avoiding.states she uses clean equipment. HPI  Past Medical History:  Diagnosis Date  . Diabetes mellitus without complication (HCC)   . Self-catheterizes urinary bladder     There are no active problems to display for this patient.   Past Surgical History:  Procedure Laterality Date  . ABDOMINAL HYSTERECTOMY      OB History    No data available       Home Medications    Prior to Admission medications   Medication Sig Start Date End Date Taking? Authorizing Provider  cetirizine (ZYRTEC) 10 MG tablet Take 1 tablet (10 mg total) by mouth daily. 09/30/15   Delorise Royals Cuthriell, PA-C  ciprofloxacin (CILOXAN) 0.3 % ophthalmic solution Place 2 drops into the right ear every 4 (four) hours while awake. 11/06/16   Chinita Pester, FNP  ciprofloxacin (CIPRO) 500 MG tablet Take 1 tablet (500 mg total) by mouth 2 (two) times daily. 09/30/15   Delorise Royals Cuthriell, PA-C  doxycycline (VIBRAMYCIN) 100 MG capsule Take 1 capsule (100 mg total) by mouth 2 (two) times daily. 11/06/16   Chinita Pester, FNP  fluticasone (FLONASE) 50 MCG/ACT nasal spray Place 1 spray into both nostrils 2 (two) times daily. 09/30/15   Delorise Royals Cuthriell, PA-C  levofloxacin (LEVAQUIN) 500 MG tablet Take 1 tablet (500 mg total) by mouth daily. 12/12/16  12/22/16  Evon Slack, PA-C  phenazopyridine (PYRIDIUM) 95 MG tablet Take 1 tablet (95 mg total) by mouth 3 (three) times daily as needed for pain. 09/30/15   Delorise Royals Cuthriell, PA-C    Family History History reviewed. No pertinent family history.  Social History Social History  Substance Use Topics  . Smoking status: Current Every Day Smoker    Packs/day: 0.50  . Smokeless tobacco: Never Used  . Alcohol use No     Allergies   Sulfa antibiotics   Review of Systems Review of Systems  Constitutional: Positive for fever. Negative for activity change, chills and fatigue.  HENT: Negative for congestion, sinus pressure and sore throat.   Eyes: Negative for visual disturbance.  Respiratory: Negative for cough, chest tightness and shortness of breath.   Cardiovascular: Negative for chest pain and leg swelling.  Gastrointestinal: Positive for nausea and vomiting. Negative for abdominal pain and diarrhea.  Genitourinary: Positive for flank pain. Negative for dysuria.  Musculoskeletal: Negative for arthralgias and gait problem.  Skin: Negative for rash.  Neurological: Negative for weakness, numbness and headaches.  Hematological: Negative for adenopathy.  Psychiatric/Behavioral: Negative for agitation, behavioral problems and confusion.     Physical Exam Updated Vital Signs BP 97/67 (BP Location: Right Arm)   Pulse 91   Temp 99.9 F (37.7 C) (Oral)   Resp 20   Ht 5' (1.524 m)   Wt 65.8 kg  SpO2 97%   BMI 28.32 kg/m   Physical Exam  Constitutional: She appears well-developed and well-nourished. No distress.  HENT:  Head: Normocephalic and atraumatic.  Right Ear: External ear normal.  Left Ear: External ear normal.  Nose: Nose normal.  Mouth/Throat: Oropharynx is clear and moist. No oropharyngeal exudate.  Eyes: Conjunctivae are normal.  Neck: Neck supple.  Cardiovascular: Normal rate and regular rhythm.   No murmur heard. Pulmonary/Chest: Effort normal and  breath sounds normal. No respiratory distress.  Abdominal: Soft. She exhibits no distension and no mass. There is no tenderness. There is no rebound and no guarding.  Left flank pain, mild.  Musculoskeletal: She exhibits no edema.  Neurological: She is alert.  Skin: Skin is warm and dry.  Psychiatric: She has a normal mood and affect. Her behavior is normal. Thought content normal.  Nursing note and vitals reviewed.    ED Treatments / Results  Labs (all labs ordered are listed, but only abnormal results are displayed) Labs Reviewed  CBC - Abnormal; Notable for the following:       Result Value   RBC 3.78 (*)    Hemoglobin 11.6 (*)    HCT 34.0 (*)    RDW 16.4 (*)    All other components within normal limits  BASIC METABOLIC PANEL - Abnormal; Notable for the following:    Sodium 133 (*)    CO2 20 (*)    Glucose, Bld 131 (*)    All other components within normal limits  URINALYSIS, COMPLETE (UACMP) WITH MICROSCOPIC - Abnormal; Notable for the following:    Color, Urine YELLOW (*)    APPearance TURBID (*)    Protein, ur >=300 (*)    Nitrite POSITIVE (*)    Leukocytes, UA MODERATE (*)    All other components within normal limits  URINE CULTURE    EKG  EKG Interpretation None       Radiology No results found.  Procedures Procedures (including critical care time)  Medications Ordered in ED Medications  levofloxacin (LEVAQUIN) tablet 500 mg (not administered)  acetaminophen (TYLENOL) tablet 1,000 mg (1,000 mg Oral Given 12/12/16 1727)  sodium chloride 0.9 % bolus 1,000 mL (1,000 mLs Intravenous New Bag/Given 12/12/16 1819)     Initial Impression / Assessment and Plan / ED Course  I have reviewed the triage vital signs and the nursing notes.  Pertinent labs & imaging results that were available during my care of the patient were reviewed by me and considered in my medical decision making (see chart for details).     46 year old female with pyelonephritis. Patient  has significant urinary tract infection that has progressed to a low-grade fever, left leg pain and nausea. CBC and BMP within normal limits. Pain improved significant with IV fluids and Tylenol. Urinalysis and urine culture obtained. Patient started on Levaquin for 10 days. She is educated on signs and symptoms return to the ED for. Final diagnoses:  Pyelonephritis    New Prescriptions New Prescriptions   LEVOFLOXACIN (LEVAQUIN) 500 MG TABLET    Take 1 tablet (500 mg total) by mouth daily.     Evon Slackhomas C Karrina Lye, PA-C 12/12/16 1951    Nita Sicklearolina Veronese, MD 12/13/16 [phone]

## 2016-12-15 LAB — URINE CULTURE: Special Requests: NORMAL

## 2017-06-28 ENCOUNTER — Other Ambulatory Visit: Payer: Self-pay | Admitting: Physician Assistant

## 2017-06-28 DIAGNOSIS — Z1239 Encounter for other screening for malignant neoplasm of breast: Secondary | ICD-10-CM

## 2017-07-12 ENCOUNTER — Inpatient Hospital Stay: Admission: RE | Admit: 2017-07-12 | Payer: Commercial Managed Care - HMO | Source: Ambulatory Visit

## 2017-10-18 ENCOUNTER — Emergency Department
Admission: EM | Admit: 2017-10-18 | Discharge: 2017-10-18 | Disposition: A | Discharge status: Home / Self Care | Payer: 59 | Attending: Emergency Medicine | Admitting: Emergency Medicine

## 2017-10-18 DIAGNOSIS — R300 Dysuria: Secondary | ICD-10-CM | POA: Diagnosis present

## 2017-10-18 DIAGNOSIS — Z79899 Other long term (current) drug therapy: Secondary | ICD-10-CM | POA: Diagnosis not present

## 2017-10-18 DIAGNOSIS — E119 Type 2 diabetes mellitus without complications: Secondary | ICD-10-CM | POA: Diagnosis not present

## 2017-10-18 DIAGNOSIS — F17200 Nicotine dependence, unspecified, uncomplicated: Secondary | ICD-10-CM | POA: Diagnosis not present

## 2017-10-18 DIAGNOSIS — N12 Tubulo-interstitial nephritis, not specified as acute or chronic: Secondary | ICD-10-CM | POA: Diagnosis not present

## 2017-10-18 LAB — URINALYSIS, COMPLETE (UACMP) WITH MICROSCOPIC
Bilirubin Urine: NEGATIVE
Glucose, UA: NEGATIVE mg/dL
KETONES UR: NEGATIVE mg/dL
Nitrite: POSITIVE — AB
PH: 6 (ref 5.0–8.0)
PROTEIN: 100 mg/dL — AB
Specific Gravity, Urine: 1.01 (ref 1.005–1.030)

## 2017-10-18 MED ORDER — LIDOCAINE HCL (PF) 1 % IJ SOLN
INTRAMUSCULAR | Status: AC
  Filled 2017-10-18: qty 5

## 2017-10-18 MED ORDER — LIDOCAINE HCL (PF) 1 % IJ SOLN
2.1000 mL | Freq: Once | INTRAMUSCULAR | Status: AC
  Administered 2017-10-18: 2.1 mL

## 2017-10-18 MED ORDER — PHENAZOPYRIDINE HCL 95 MG PO TABS: 95.0000 mg | ORAL_TABLET | Freq: Three times a day (TID) | ORAL | 0 refills | Status: DC | PRN

## 2017-10-18 MED ORDER — LEVOFLOXACIN 750 MG PO TABS
750.0000 mg | ORAL_TABLET | Freq: Once | ORAL | Status: AC
Start: 2017-10-18 — End: 2017-10-18
  Administered 2017-10-18: 750 mg via ORAL

## 2017-10-18 MED ORDER — LEVOFLOXACIN 750 MG PO TABS: 750.0000 mg | ORAL_TABLET | Freq: Every day | ORAL | 0 refills | Status: AC

## 2017-10-18 MED ORDER — CEFTRIAXONE SODIUM 1 G IJ SOLR
INTRAMUSCULAR | Status: DC
Start: 2017-10-18 — End: 2017-10-19
  Filled 2017-10-18: qty 10

## 2017-10-18 MED ORDER — LEVOFLOXACIN 750 MG PO TABS
ORAL_TABLET | ORAL | Status: AC
  Filled 2017-10-18: qty 1

## 2017-10-18 MED ORDER — CEFTRIAXONE SODIUM 1 G IJ SOLR
1.0000 g | Freq: Once | INTRAMUSCULAR | Status: AC
  Administered 2017-10-18: 1 g via INTRAMUSCULAR

## 2017-10-18 NOTE — ED Notes (Signed)
Pt states she self caths due to inablility to urinate after a bladder "tach". Pt states has had several days of pelvic pain, back pain and two days of cloudy smelly urine. Urine is cloudy.

## 2017-10-18 NOTE — ED Provider Notes (Signed)
[facility] Emergency Department Provider Note  ____________________________________________  Time seen: Approximately 8:39 PM  I have reviewed the triage vital signs and the nursing notes.   HISTORY  Chief Complaint Urinary Tract Infection    HPI Susan Munoz is a 47 y.o. female who presents emergency department complaining of dysuria, suprapubic pain, left-sided flank pain.  Patient has a history of repeated UTIs due to having to self cath due to a bladder tack that causes her to be unable to urinate.  Patient reports that symptoms have been worsening over the reports that symptoms are similar to the previous pyelonephritis symptoms that she has had.  She denies any fevers or chills, body aches, emesis, abdominal pain, diarrhea or constipation.  She has not tried any medications for this complaint prior to arrival.  Patient does not see urology due to "costs".  Patient denies any hematuria.  No other complaints at this time.  Past Medical History:  Diagnosis Date  . Diabetes mellitus without complication (HCC)   . Self-catheterizes urinary bladder     There are no active problems to display for this patient.   Past Surgical History:  Procedure Laterality Date  . ABDOMINAL HYSTERECTOMY      Prior to Admission medications   Medication Sig Start Date End Date Taking? Authorizing Provider  cetirizine (ZYRTEC) 10 MG tablet Take 1 tablet (10 mg total) by mouth daily. 09/30/15   Cuthriell, Delorise Royals, PA-C  ciprofloxacin (CILOXAN) 0.3 % ophthalmic solution Place 2 drops into the right ear every 4 (four) hours while awake. 11/06/16   Triplett, Rulon Eisenmenger B, FNP  ciprofloxacin (CIPRO) 500 MG tablet Take 1 tablet (500 mg total) by mouth 2 (two) times daily. 09/30/15   Cuthriell, Delorise Royals, PA-C  doxycycline (VIBRAMYCIN) 100 MG capsule Take 1 capsule (100 mg total) by mouth 2 (two) times daily. 11/06/16   Triplett, Rulon Eisenmenger B, FNP  fluticasone (FLONASE) 50 MCG/ACT nasal spray  Place 1 spray into both nostrils 2 (two) times daily. 09/30/15   Cuthriell, Delorise Royals, PA-C  levofloxacin (LEVAQUIN) 750 MG tablet Take 1 tablet (750 mg total) by mouth daily for 10 days. 10/18/17 10/28/17  Cuthriell, Delorise Royals, PA-C  phenazopyridine (PYRIDIUM) 95 MG tablet Take 1 tablet (95 mg total) by mouth 3 (three) times daily as needed for pain. 10/18/17   Cuthriell, Delorise Royals, PA-C    Allergies Sulfa antibiotics  No family history on file.  Social History Social History   Tobacco Use  . Smoking status: Current Every Day Smoker    Packs/day: 0.50  . Smokeless tobacco: Never Used  Substance Use Topics  . Alcohol use: No  . Drug use: No     Review of Systems  Constitutional: No fever/chills Eyes: No visual changes.  Cardiovascular: no chest pain. Respiratory: no cough. No SOB. Gastrointestinal: No abdominal pain.  No nausea, no vomiting.  No diarrhea.  No constipation. Genitourinary: Positive for left flank pain with burning in her bladder.  Patient self caths so there is no frank dysuria.  Patient does report that there is increased tenderness with self cath.  No hematuria. Musculoskeletal: Negative for musculoskeletal pain. Skin: Negative for rash, abrasions, lacerations, ecchymosis. Neurological: Negative for headaches, focal weakness or numbness. 10-point ROS otherwise negative.  ____________________________________________   PHYSICAL EXAM:  VITAL SIGNS: ED Triage Vitals  Enc Vitals Group     BP 10/18/17 2019 (!) 164/78     Pulse Rate 10/18/17 2019 85     Resp 10/18/17  2019 18     Temp 10/18/17 2019 97.9 F (36.6 C)     Temp Source 10/18/17 2019 Oral     SpO2 10/18/17 2019 100 %     Weight 10/18/17 2020 145 lb (65.8 kg)     Height --      Head Circumference --      Peak Flow --      Pain Score 10/18/17 2019 5     Pain Loc --      Pain Edu? --      Excl. in GC? --      Constitutional: Alert and oriented. Well appearing and in no acute  distress. Eyes: Conjunctivae are normal. PERRL. EOMI. Head: Atraumatic. Neck: No stridor.    Cardiovascular: Normal rate, regular rhythm. Normal S1 and S2.  Good peripheral circulation. Respiratory: Normal respiratory effort without tachypnea or retractions. Lungs CTAB. Good air entry to the bases with no decreased or absent breath sounds. Gastrointestinal: Bowel sounds 4 quadrants. Soft and nontender to palpation with the exception of the suprapubic region which reveals some mild tenderness.. No guarding or rigidity. No palpable masses. No distention.  Positive for mild left-sided CVA tenderness. Musculoskeletal: Full range of motion to all extremities. No gross deformities appreciated. Neurologic:  Normal speech and language. No gross focal neurologic deficits are appreciated.  Skin:  Skin is warm, dry and intact. No rash noted. Psychiatric: Mood and affect are normal. Speech and behavior are normal. Patient exhibits appropriate insight and judgement.   ____________________________________________   LABS (all labs ordered are listed, but only abnormal results are displayed)  Labs Reviewed  URINALYSIS, COMPLETE (UACMP) WITH MICROSCOPIC - Abnormal; Notable for the following components:      Result Value   Color, Urine YELLOW (*)    APPearance TURBID (*)    Hgb urine dipstick LARGE (*)    Protein, ur 100 (*)    Nitrite POSITIVE (*)    Leukocytes, UA LARGE (*)    Bacteria, UA MANY (*)    Squamous Epithelial / LPF 6-30 (*)    All other components within normal limits  URINE CULTURE   ____________________________________________  EKG   ____________________________________________  RADIOLOGY   No results found.  ____________________________________________    PROCEDURES  Procedure(s) performed:    Procedures    Medications  cefTRIAXone (ROCEPHIN) injection 1 g (1 g Intramuscular Given 10/18/17 2102)  lidocaine (PF) (XYLOCAINE) 1 % injection 2.1 mL (2.1 mLs  Infiltration Given 10/18/17 2103)  levofloxacin (LEVAQUIN) tablet 750 mg (750 mg Oral Given 10/18/17 2103)     ____________________________________________   INITIAL IMPRESSION / ASSESSMENT AND PLAN / ED COURSE  Pertinent labs & imaging results that were available during my care of the patient were reviewed by me and considered in my medical decision making (see chart for details).  Review of the [place] CSRS was performed in accordance of the [facility] prior to dispensing any controlled drugs.     Patient's diagnosis is consistent with pyelonephritis.  Patient presents with suprapubic pain, left-sided flank pain, increased pain with self cath.  Initial differential included cystitis, UTI, pyelonephritis.  Patient has a significant history of recurrent UTIs.  Typically, patient let us symptoms progressed to low mild pyelonephritis.  At this time, patient symptoms are consistent with pyelonephritis accompanying with significant findings on urinalysis consistent with UTI.  Patient will be treated with Rocephin and Levaquin for this complaint.  Patient is afebrile, able to hydrate she has no emesis or nausea.  At this  time, patient is a candidate for at home treatment.  I will culture patient's urine as she has had multiple UTIs over the past several years.  If culture returns with resistant bacteria, will change the patient's antibiotics.  If symptoms worsen she will return to the emergency department or see primary care.. Patient will be discharged home with prescriptions for Levaquin and Pyridium. Patient is to follow up with primary care as needed or otherwise directed. Patient is given ED precautions to return to the ED for any worsening or new symptoms.     ____________________________________________  FINAL CLINICAL IMPRESSION(S) / ED DIAGNOSES  Final diagnoses:  Pyelonephritis      NEW MEDICATIONS STARTED DURING THIS VISIT:  ED Discharge Orders        Ordered    levofloxacin (LEVAQUIN)  750 MG tablet  Daily     10/18/17 2104    phenazopyridine (PYRIDIUM) 95 MG tablet  3 times daily PRN     10/18/17 2104          This chart was dictated using voice recognition software/Dragon. Despite best efforts to proofread, errors can occur which can change the meaning. Any change was purely unintentional.    Racheal PatchesCuthriell, Jonathan D, PA-C 10/18/17 2106    Arnaldo NatalMalinda, Paul F, MD 10/18/17 2147

## 2017-10-18 NOTE — ED Triage Notes (Signed)
Pt reports that she is having painful urination and thinks that she has a UTI.  Pt is A&Ox4, in NAD, ambulatory to triage.

## 2017-10-20 LAB — URINE CULTURE: SPECIAL REQUESTS: NORMAL

## 2018-04-18 ENCOUNTER — Emergency Department
Admission: EM | Admit: 2018-04-18 | Discharge: 2018-04-18 | Disposition: A | Discharge status: Home / Self Care | Payer: 59 | Attending: Emergency Medicine | Admitting: Emergency Medicine

## 2018-04-18 ENCOUNTER — Encounter: Payer: Self-pay | Admitting: Emergency Medicine

## 2018-04-18 ENCOUNTER — Other Ambulatory Visit: Payer: Self-pay

## 2018-04-18 DIAGNOSIS — N390 Urinary tract infection, site not specified: Secondary | ICD-10-CM | POA: Insufficient documentation

## 2018-04-18 DIAGNOSIS — F17200 Nicotine dependence, unspecified, uncomplicated: Secondary | ICD-10-CM | POA: Diagnosis not present

## 2018-04-18 DIAGNOSIS — E119 Type 2 diabetes mellitus without complications: Secondary | ICD-10-CM | POA: Diagnosis not present

## 2018-04-18 DIAGNOSIS — Z87440 Personal history of urinary (tract) infections: Secondary | ICD-10-CM | POA: Diagnosis not present

## 2018-04-18 DIAGNOSIS — R300 Dysuria: Secondary | ICD-10-CM | POA: Diagnosis present

## 2018-04-18 DIAGNOSIS — Z79899 Other long term (current) drug therapy: Secondary | ICD-10-CM | POA: Diagnosis not present

## 2018-04-18 LAB — URINALYSIS, COMPLETE (UACMP) WITH MICROSCOPIC
BILIRUBIN URINE: NEGATIVE
GLUCOSE, UA: NEGATIVE mg/dL
Hgb urine dipstick: NEGATIVE
Ketones, ur: 5 mg/dL — AB
Nitrite: NEGATIVE
PH: 7 (ref 5.0–8.0)
Protein, ur: >=300 mg/dL — AB
RBC / HPF: >50 RBC/hpf — ABNORMAL HIGH (ref 0–5)
Specific Gravity, Urine: 1.017 (ref 1.005–1.030)
WBC, UA: >50 WBC/hpf — ABNORMAL HIGH (ref 0–5)

## 2018-04-18 MED ORDER — GENTLECATH URINARY CATHETER MISC: 1.0000 [IU] | 6 refills | Status: DC | PRN

## 2018-04-18 MED ORDER — CEFTRIAXONE SODIUM 1 G IJ SOLR
1.0000 g | Freq: Once | INTRAMUSCULAR | Status: AC
  Administered 2018-04-18: 1 g via INTRAMUSCULAR
  Filled 2018-04-18: qty 10

## 2018-04-18 MED ORDER — CEPHALEXIN 500 MG PO CAPS: 500.0000 mg | ORAL_CAPSULE | Freq: Four times a day (QID) | ORAL | 0 refills | Status: DC

## 2018-04-18 MED ORDER — LIDOCAINE HCL (PF) 1 % IJ SOLN
2.1000 mL | Freq: Once | INTRAMUSCULAR | Status: AC
  Administered 2018-04-18: 2.1 mL
  Filled 2018-04-18: qty 5

## 2018-04-18 NOTE — ED Notes (Signed)
Patient discharged to home per MD order. Patient in stable condition, and deemed medically cleared by ED provider for discharge. Discharge instructions reviewed with patient/family using "Teach Back"; verbalized understanding of medication education and administration, and information about follow-up care. Denies further concerns. ° °

## 2018-04-18 NOTE — ED Provider Notes (Signed)
[facility] Emergency Department Provider Note  ____________________________________________  Time seen: Approximately 6:22 PM  I have reviewed the triage vital signs and the nursing notes.   HISTORY  Chief Complaint Dysuria    HPI Susan Munoz is a 47 y.o. female who presents the emergency department complaining of UTI-like symptoms.  Patient has a history of repeat urinary tract infections as she has to use a self catheter for urination.  Patient reports that she had bladder tack placed several years ago and has had to use self catheters for urination since.  Patient reports that when she develops a UTI she has burning and pressure in the suprapubic region.  She is also experienced multiple episodes of pyelonephritis but denies any similar complaints at this time.  She has seen no blood in her urine.  As patient uses a self catheter, she is unable to determine dysuria status.  Patient reports that she has been on multiple antibiotics in the past but states that she is allergic to sulfa antibiotics and Cipro does not alleviate symptoms.  Patient reports that the company that used to send her self catheters has gone out of business and she is having difficulty obtaining self catheters and has reused catheters multiple times in the recent past urination.   Patient denies any abdominal pain, fevers or chills, nausea vomiting diarrhea constipation, flank pain at this time.  She does endorse some suprapubic pressure/burning sensation.   Past Medical History:  Diagnosis Date  . Diabetes mellitus without complication (HCC)   . Self-catheterizes urinary bladder     There are no active problems to display for this patient.   Past Surgical History:  Procedure Laterality Date  . ABDOMINAL HYSTERECTOMY      Prior to Admission medications   Medication Sig Start Date End Date Taking? Authorizing Provider  Catheters [facility](GENTLECATH URINARY CATHETER) MISC 1 Units by Does not  apply route as needed. Use as needed for self-catheterization for urinary output 04/18/18   Orbie Grupe, Delorise RoyalsJonathan D, PA-C  cephALEXin (KEFLEX) 500 MG capsule Take 1 capsule (500 mg total) by mouth 4 (four) times daily. 04/18/18   Keymon Mcelroy, Delorise RoyalsJonathan D, PA-C  cetirizine (ZYRTEC) 10 MG tablet Take 1 tablet (10 mg total) by mouth daily. 09/30/15   Arrion Broaddus, Delorise RoyalsJonathan D, PA-C  ciprofloxacin (CILOXAN) 0.3 % ophthalmic solution Place 2 drops into the right ear every 4 (four) hours while awake. 11/06/16   Triplett, Rulon Eisenmengerari B, FNP  ciprofloxacin (CIPRO) 500 MG tablet Take 1 tablet (500 mg total) by mouth 2 (two) times daily. 09/30/15   Eryca Bolte, Delorise RoyalsJonathan D, PA-C  doxycycline (VIBRAMYCIN) 100 MG capsule Take 1 capsule (100 mg total) by mouth 2 (two) times daily. 11/06/16   Triplett, Rulon Eisenmengerari B, FNP  fluticasone (FLONASE) 50 MCG/ACT nasal spray Place 1 spray into both nostrils 2 (two) times daily. 09/30/15   Tiera Mensinger, Delorise RoyalsJonathan D, PA-C  phenazopyridine (PYRIDIUM) 95 MG tablet Take 1 tablet (95 mg total) by mouth 3 (three) times daily as needed for pain. 10/18/17   Johnross Nabozny, Delorise RoyalsJonathan D, PA-C    Allergies Sulfa antibiotics  No family history on file.  Social History Social History   Tobacco Use  . Smoking status: Current Every Day Smoker    Packs/day: 0.50  . Smokeless tobacco: Never Used  Substance Use Topics  . Alcohol use: No  . Drug use: No     Review of Systems  Constitutional: No fever/chills Eyes: No visual changes. Cardiovascular: no chest pain. Respiratory: no cough.  No SOB. Gastrointestinal: No abdominal pain.  No nausea, no vomiting.  No diarrhea.  No constipation. Genitourinary: Uses self catheter for urination.  Complaining of suprapubic pressure. Musculoskeletal: Negative for musculoskeletal pain. Skin: Negative for rash, abrasions, lacerations, ecchymosis. Neurological: Negative for headaches, focal weakness or numbness. 10-point ROS otherwise  negative.  ____________________________________________   PHYSICAL EXAM:  VITAL SIGNS: ED Triage Vitals  Enc Vitals Group     BP 04/18/18 1755 (!) 146/82     Pulse Rate 04/18/18 1755 91     Resp 04/18/18 1755 16     Temp 04/18/18 1755 98.5 F (36.9 C)     Temp Source 04/18/18 1755 Oral     SpO2 04/18/18 1755 98 %     Weight 04/18/18 1754 148 lb (67.1 kg)     Height 04/18/18 1754 5' (1.524 m)     Head Circumference --      Peak Flow --      Pain Score 04/18/18 1754 0     Pain Loc --      Pain Edu? --      Excl. in GC? --      Constitutional: Alert and oriented. Well appearing and in no acute distress. Eyes: Conjunctivae are normal. PERRL. EOMI. Head: Atraumatic. Neck: No stridor.    Cardiovascular: Normal rate, regular rhythm. Normal S1 and S2.  Good peripheral circulation. Respiratory: Normal respiratory effort without tachypnea or retractions. Lungs CTAB. Good air entry to the bases with no decreased or absent breath sounds. Gastrointestinal: Bowel sounds 4 quadrants. Soft to palpation all quadrants.  Patient has mild tenderness to palpation of the suprapubic region.  No guarding or rigidity. No palpable masses. No distention. No CVA tenderness. Musculoskeletal: Full range of motion to all extremities. No gross deformities appreciated. Neurologic:  Normal speech and language. No gross focal neurologic deficits are appreciated.  Skin:  Skin is warm, dry and intact. No rash noted. Psychiatric: Mood and affect are normal. Speech and behavior are normal. Patient exhibits appropriate insight and judgement.   ____________________________________________   LABS (all labs ordered are listed, but only abnormal results are displayed)  Labs Reviewed  URINALYSIS, COMPLETE (UACMP) WITH MICROSCOPIC - Abnormal; Notable for the following components:      Result Value   Color, Urine YELLOW (*)    APPearance TURBID (*)    Ketones, ur 5 (*)    Protein, ur >=300 (*)    Leukocytes,  UA MODERATE (*)    RBC / HPF >50 (*)    WBC, UA >50 (*)    Bacteria, UA MANY (*)    All other components within normal limits  URINE CULTURE   ____________________________________________  EKG   ____________________________________________  RADIOLOGY   No results found.  ____________________________________________    PROCEDURES  Procedure(s) performed:    Procedures    Medications  cefTRIAXone (ROCEPHIN) injection 1 g (has no administration in time range)  lidocaine (PF) (XYLOCAINE) 1 % injection 2.1 mL (has no administration in time range)     ____________________________________________   INITIAL IMPRESSION / ASSESSMENT AND PLAN / ED COURSE  Pertinent labs & imaging results that were available during my care of the patient were reviewed by me and considered in my medical decision making (see chart for details).  Review of the [place] CSRS was performed in accordance of the [facility] prior to dispensing any controlled drugs.      Patient's diagnosis is consistent with UTI.  Patient presents the emergency department with UTI symptoms.  Urine reveals leukocytosis and bacteria consistent with UTI.  Other findings and urine are consistent with previous urinalysis.  Patient requires intermittent self catheter for urination.  The company she typically receives product from has gone out of business and she has had difficulty obtaining new prescription.  Patient has been reusing catheters.  This is likely because of current UTI.  No indication of pyelonephritis.  No indication for labs or imaging.  Patient is given injection of Rocephin and oral Keflex for treatment.  Urine is sent for culture.  Patient will also be prescribed new self catheters..  Patient is to follow-up with urology.  Patient is given ED precautions to return to the ED for any worsening or new symptoms.     ____________________________________________  FINAL CLINICAL IMPRESSION(S) / ED DIAGNOSES  Final  diagnoses:  Lower urinary tract infectious disease      NEW MEDICATIONS STARTED DURING THIS VISIT:  ED Discharge Orders        Ordered    cephALEXin (KEFLEX) 500 MG capsule  4 times daily     04/18/18 1838    Catheters (GENTLECATH URINARY CATHETER) MISC  As needed     04/18/18 1839          This chart was dictated using voice recognition software/Dragon. Despite best efforts to proofread, errors can occur which can change the meaning. Any change was purely unintentional.    Racheal Patches, PA-C 04/18/18 1845    Don Perking, [place], MD 04/18/18 2249

## 2018-04-18 NOTE — ED Triage Notes (Signed)
Urinary burning, frequency, urgency x 1 week.

## 2018-04-21 LAB — URINE CULTURE
Culture: >=100000 — AB
SPECIAL REQUESTS: NORMAL

## 2018-05-09 ENCOUNTER — Emergency Department
Admission: EM | Admit: 2018-05-09 | Discharge: 2018-05-09 | Disposition: A | Discharge status: Home / Self Care | Payer: 59 | Attending: Emergency Medicine | Admitting: Emergency Medicine

## 2018-05-09 ENCOUNTER — Other Ambulatory Visit: Payer: Self-pay

## 2018-05-09 DIAGNOSIS — R1032 Left lower quadrant pain: Secondary | ICD-10-CM | POA: Diagnosis not present

## 2018-05-09 DIAGNOSIS — N3001 Acute cystitis with hematuria: Secondary | ICD-10-CM | POA: Diagnosis not present

## 2018-05-09 DIAGNOSIS — E119 Type 2 diabetes mellitus without complications: Secondary | ICD-10-CM | POA: Diagnosis not present

## 2018-05-09 DIAGNOSIS — Z79899 Other long term (current) drug therapy: Secondary | ICD-10-CM | POA: Diagnosis not present

## 2018-05-09 DIAGNOSIS — R300 Dysuria: Secondary | ICD-10-CM | POA: Diagnosis not present

## 2018-05-09 DIAGNOSIS — R1031 Right lower quadrant pain: Secondary | ICD-10-CM | POA: Diagnosis present

## 2018-05-09 DIAGNOSIS — F17210 Nicotine dependence, cigarettes, uncomplicated: Secondary | ICD-10-CM | POA: Diagnosis not present

## 2018-05-09 LAB — URINALYSIS, COMPLETE (UACMP) WITH MICROSCOPIC
Bilirubin Urine: UNDETERMINED
GLUCOSE, UA: UNDETERMINED mg/dL
Hgb urine dipstick: UNDETERMINED
Ketones, ur: UNDETERMINED mg/dL
Leukocytes, UA: UNDETERMINED
Nitrite: UNDETERMINED
PH: UNDETERMINED (ref 5.0–8.0)
PROTEIN: UNDETERMINED mg/dL
Specific Gravity, Urine: 1.028 (ref 1.005–1.030)
WBC, UA: >50 WBC/hpf (ref 0–5)

## 2018-05-09 MED ORDER — LEVOFLOXACIN 750 MG PO TABS: 750.0000 mg | ORAL_TABLET | Freq: Every day | ORAL | 0 refills | Status: AC

## 2018-05-09 MED ORDER — PHENAZOPYRIDINE HCL 200 MG PO TABS: 200.0000 mg | ORAL_TABLET | Freq: Three times a day (TID) | ORAL | 0 refills | Status: DC | PRN

## 2018-05-09 MED ORDER — LIDOCAINE HCL (PF) 1 % IJ SOLN
1.2000 mL | Freq: Once | INTRAMUSCULAR | Status: AC
  Administered 2018-05-09: 1.2 mL
  Filled 2018-05-09: qty 5

## 2018-05-09 MED ORDER — CEFTRIAXONE SODIUM 1 G IJ SOLR
1.0000 g | Freq: Once | INTRAMUSCULAR | Status: AC
  Administered 2018-05-09: 1 g via INTRAMUSCULAR
  Filled 2018-05-09: qty 10

## 2018-05-09 MED ORDER — OXYCODONE HCL 5 MG PO TABS
5.0000 mg | ORAL_TABLET | Freq: Once | ORAL | Status: AC
  Administered 2018-05-09: 5 mg via ORAL
  Filled 2018-05-09: qty 1

## 2018-05-09 MED ORDER — PHENAZOPYRIDINE HCL 200 MG PO TABS
200.0000 mg | ORAL_TABLET | Freq: Once | ORAL | Status: AC
  Administered 2018-05-09: 200 mg via ORAL
  Filled 2018-05-09: qty 1

## 2018-05-09 NOTE — ED Provider Notes (Signed)
[facility] Emergency Department Provider Note  ____________________________________________  Time seen: Approximately 9:12 PM  I have reviewed the triage vital signs and the nursing notes.   HISTORY  Chief Complaint Flank Pain and Hematuria    HPI Susan Munoz is a 47 y.o. female who presents to the emergency department for treatment and evaluation of dysuria, bilateral flank pain, and hematuria. Recent UTI. Finished antibiotics but doesn't feel like it ever totally went away. Patient self-caths.   Past Medical History:  Diagnosis Date  . Diabetes mellitus without complication (HCC)   . Self-catheterizes urinary bladder     There are no active problems to display for this patient.   Past Surgical History:  Procedure Laterality Date  . ABDOMINAL HYSTERECTOMY      Prior to Admission medications   Medication Sig Start Date End Date Taking? Authorizing Provider  Catheters [facility](GENTLECATH URINARY CATHETER) MISC 1 Units by Does not apply route as needed. Use as needed for self-catheterization for urinary output 04/18/18   Cuthriell, Delorise RoyalsJonathan D, PA-C  cephALEXin (KEFLEX) 500 MG capsule Take 1 capsule (500 mg total) by mouth 4 (four) times daily. 04/18/18   Cuthriell, Delorise RoyalsJonathan D, PA-C  cetirizine (ZYRTEC) 10 MG tablet Take 1 tablet (10 mg total) by mouth daily. 09/30/15   Cuthriell, Delorise RoyalsJonathan D, PA-C  ciprofloxacin (CILOXAN) 0.3 % ophthalmic solution Place 2 drops into the right ear every 4 (four) hours while awake. 11/06/16   Susan Munoz, Susan Eisenmengerari B, FNP  ciprofloxacin (CIPRO) 500 MG tablet Take 1 tablet (500 mg total) by mouth 2 (two) times daily. 09/30/15   Cuthriell, Delorise RoyalsJonathan D, PA-C  doxycycline (VIBRAMYCIN) 100 MG capsule Take 1 capsule (100 mg total) by mouth 2 (two) times daily. 11/06/16   Elvyn Krohn, Susan Eisenmengerari B, FNP  fluticasone (FLONASE) 50 MCG/ACT nasal spray Place 1 spray into both nostrils 2 (two) times daily. 09/30/15   Cuthriell, Delorise RoyalsJonathan D, PA-C  levofloxacin  (LEVAQUIN) 750 MG tablet Take 1 tablet (750 mg total) by mouth daily for 7 days. 05/09/18 05/16/18  Syriah Delisi, Kasandra Knudsenari B, FNP  phenazopyridine (PYRIDIUM) 200 MG tablet Take 1 tablet (200 mg total) by mouth 3 (three) times daily as needed for pain. 05/09/18   Providence Stivers, Susan Eisenmengerari B, FNP    Allergies Sulfa antibiotics  No family history on file.  Social History Social History   Tobacco Use  . Smoking status: Current Every Day Smoker    Packs/day: 0.50  . Smokeless tobacco: Never Used  Substance Use Topics  . Alcohol use: No  . Drug use: No    Review of Systems Constitutional: Negative for fever. Respiratory: Negative for shortness of breath or cough. Gastrointestinal: Positive for abdominal pain; negative for nausea , negative for vomiting. Genitourinary: Positive for dysuria , negative for vaginal discharge. Musculoskeletal: Positive for back pain. Skin: Negative for acute skin changes/rash/lesion. ____________________________________________   PHYSICAL EXAM:  VITAL SIGNS: ED Triage Vitals  Enc Vitals Group     BP 05/09/18 1735 109/82     Pulse Rate 05/09/18 1735 (!) 108     Resp 05/09/18 1735 18     Temp 05/09/18 1735 98.6 F (37 C)     Temp Source 05/09/18 1735 Oral     SpO2 05/09/18 1735 100 %     Weight 05/09/18 1736 147 lb 11.3 oz (67 kg)     Height 05/09/18 1736 5' (1.524 m)     Head Circumference --      Peak Flow --  Pain Score 05/09/18 1736 9     Pain Loc --      Pain Edu? --      Excl. in GC? --     Constitutional: Alert and oriented. Well appearing and in no acute distress. Eyes: Conjunctivae are normal. Head: Atraumatic. Nose: No congestion/rhinnorhea. Mouth/Throat: Mucous membranes are moist. Respiratory: Normal respiratory effort.  No retractions. Gastrointestinal: Bowel sounds active x 4; Abdomen is soft without rebound or guarding. Genitourinary: Pelvic exam: Not indicated. Musculoskeletal: No extremity tenderness nor edema.  Neurologic:  Normal  speech and language. No gross focal neurologic deficits are appreciated. Speech is normal. No gait instability. Skin:  Skin is warm, dry and intact. No rash noted on exposed skin. Psychiatric: Mood and affect are normal. Speech and behavior are normal.  ____________________________________________   LABS (all labs ordered are listed, but only abnormal results are displayed)  Labs Reviewed  URINALYSIS, COMPLETE (UACMP) WITH MICROSCOPIC - Abnormal; Notable for the following components:      Result Value   Color, Urine AMBER (*)    APPearance TURBID (*)    Bacteria, UA MANY (*)    All other components within normal limits  URINE CULTURE   ____________________________________________  RADIOLOGY  Not indicated. ____________________________________________  Procedures  ____________________________________________  47 year old female who presents to the emergency department for treatment and evaluation of dysuria. Urine was sent for culture. She will be treated with IM Rocephin and will then take Levaquin at home. She is to follow up with [facility] clinic when possible. She was advised to return to the ER for symptoms that change or worsen or for new concerns if unable to schedule an appointment.  INITIAL IMPRESSION / ASSESSMENT AND PLAN / ED COURSE  Pertinent labs & imaging results that were available during my care of the patient were reviewed by me and considered in my medical decision making (see chart for details).  ____________________________________________   FINAL CLINICAL IMPRESSION(S) / ED DIAGNOSES  Final diagnoses:  Acute cystitis with hematuria    Note:  This document was prepared using Dragon voice recognition software and may include unintentional dictation errors.    Chinita Pester, FNP 05/11/18 7829    Phineas Semen, MD 05/12/18 [phone]

## 2018-05-09 NOTE — ED Triage Notes (Signed)
Pt c/o bilateral flank pain, hematuria that began today. Recent tx of UTI. Finished antibiotics. Pt self-caths.

## 2018-05-12 LAB — URINE CULTURE

## 2018-05-13 NOTE — Progress Notes (Signed)
ED Culture Results   Allergies: Sulfa Visit Date: 05/09/18 Chief Complaint: Flank pain and hematuria Culture Type: Urine Culture Results E. coli Original Abx given: Levaquin Original Abx sensitive, intermediate, or resistant: Resistant Recommended Abx: Macrobid 100mg  bid x 5 days ED Physician: Larna DaughtersGoodman Contacted Patient: yes Prescription Called into: [facility] - [address]   Clovia CuffLisa Giuseppe Duchemin, PharmD, BCPS 05/13/2018 7:00 PM

## 2018-08-15 ENCOUNTER — Encounter: Payer: Self-pay | Admitting: Emergency Medicine

## 2018-08-15 ENCOUNTER — Emergency Department: Payer: 59

## 2018-08-15 ENCOUNTER — Emergency Department
Admission: EM | Admit: 2018-08-15 | Discharge: 2018-08-15 | Disposition: A | Discharge status: Home / Self Care | Payer: 59 | Attending: Emergency Medicine | Admitting: Emergency Medicine

## 2018-08-15 ENCOUNTER — Other Ambulatory Visit: Payer: Self-pay

## 2018-08-15 DIAGNOSIS — Z79899 Other long term (current) drug therapy: Secondary | ICD-10-CM | POA: Diagnosis not present

## 2018-08-15 DIAGNOSIS — F17200 Nicotine dependence, unspecified, uncomplicated: Secondary | ICD-10-CM | POA: Insufficient documentation

## 2018-08-15 DIAGNOSIS — R1030 Lower abdominal pain, unspecified: Secondary | ICD-10-CM | POA: Diagnosis present

## 2018-08-15 DIAGNOSIS — K5900 Constipation, unspecified: Secondary | ICD-10-CM

## 2018-08-15 DIAGNOSIS — E119 Type 2 diabetes mellitus without complications: Secondary | ICD-10-CM | POA: Diagnosis not present

## 2018-08-15 DIAGNOSIS — N3001 Acute cystitis with hematuria: Secondary | ICD-10-CM | POA: Diagnosis not present

## 2018-08-15 LAB — URINALYSIS, COMPLETE (UACMP) WITH MICROSCOPIC
BILIRUBIN URINE: NEGATIVE
Bacteria, UA: NONE SEEN
GLUCOSE, UA: NEGATIVE mg/dL
KETONES UR: NEGATIVE mg/dL
Nitrite: NEGATIVE
PH: 6 (ref 5.0–8.0)
PROTEIN: 100 mg/dL — AB
Specific Gravity, Urine: 1.009 (ref 1.005–1.030)

## 2018-08-15 MED ORDER — POLYETHYLENE GLYCOL 3350 17 GM/SCOOP PO POWD: 1.0000 | Freq: Once | ORAL | 0 refills | Status: AC

## 2018-08-15 MED ORDER — CEPHALEXIN 500 MG PO CAPS: 500.0000 mg | ORAL_CAPSULE | Freq: Three times a day (TID) | ORAL | 0 refills | Status: AC

## 2018-08-15 MED ORDER — CEFTRIAXONE SODIUM 1 G IJ SOLR
1.0000 g | INTRAMUSCULAR | Status: DC
  Administered 2018-08-15: 1 g via INTRAMUSCULAR
  Filled 2018-08-15: qty 10

## 2018-08-15 MED ORDER — MAGNESIUM CITRATE PO SOLN
1.0000 | Freq: Once | ORAL | Status: AC
  Administered 2018-08-15: 1 via ORAL

## 2018-08-15 MED ORDER — DOCUSATE SODIUM 50 MG/5ML PO LIQD
50.0000 mg | Freq: Once | ORAL | Status: AC
  Administered 2018-08-15: 50 mg via ORAL
  Filled 2018-08-15: qty 10

## 2018-08-15 MED ORDER — MAGNESIUM CITRATE PO SOLN
ORAL | Status: AC
  Filled 2018-08-15: qty 296

## 2018-08-15 NOTE — ED Triage Notes (Signed)
Pt states she feels like she has a UTI, has lower back pain and also hasn't had a bm in a week, except for a small one this am, has not taken any stool softeners or laxatives. Appears in NAD.

## 2018-08-15 NOTE — ED Provider Notes (Signed)
[facility] Emergency Department Provider Note  ____________________________________________  Time seen: Approximately 4:04 PM  I have reviewed the triage vital signs and the nursing notes.   HISTORY  Chief Complaint Recurrent UTI and Constipation    HPI Susan Munoz is a 47 y.o. female that presents to the emergency department for evaluation of suprapubic tenderness for 2 days and constipation for 1 week.  She had a small bowel movement this morning but was harder than usual.  She still feels like she needs to have a bowel movement.  Patient has frequent urinary tract infections, about 1/month.  She self catheterizes for neurogenic bladder.  She uses a new catheter every time she caths.  Patient states that she has chronic low back pain, which has not changed in character recently.  No fever, chills, nausea, vomiting, abdominal pain.  Past Medical History:  Diagnosis Date  . Diabetes mellitus without complication (HCC)   . Self-catheterizes urinary bladder     There are no active problems to display for this patient.   Past Surgical History:  Procedure Laterality Date  . ABDOMINAL HYSTERECTOMY      Prior to Admission medications   Medication Sig Start Date End Date Taking? Authorizing Provider  Catheters [facility](GENTLECATH URINARY CATHETER) MISC 1 Units by Does not apply route as needed. Use as needed for self-catheterization for urinary output 04/18/18   Cuthriell, Delorise RoyalsJonathan D, PA-C  cephALEXin (KEFLEX) 500 MG capsule Take 1 capsule (500 mg total) by mouth 3 (three) times daily for 10 days. 08/15/18 08/25/18  Enid DerryWagner, Yaretzi Ernandez, PA-C  cetirizine (ZYRTEC) 10 MG tablet Take 1 tablet (10 mg total) by mouth daily. 09/30/15   Cuthriell, Delorise RoyalsJonathan D, PA-C  ciprofloxacin (CILOXAN) 0.3 % ophthalmic solution Place 2 drops into the right ear every 4 (four) hours while awake. 11/06/16   Triplett, Rulon Eisenmengerari B, FNP  ciprofloxacin (CIPRO) 500 MG tablet Take 1 tablet (500 mg total) by  mouth 2 (two) times daily. 09/30/15   Cuthriell, Delorise RoyalsJonathan D, PA-C  doxycycline (VIBRAMYCIN) 100 MG capsule Take 1 capsule (100 mg total) by mouth 2 (two) times daily. 11/06/16   Triplett, Rulon Eisenmengerari B, FNP  fluticasone (FLONASE) 50 MCG/ACT nasal spray Place 1 spray into both nostrils 2 (two) times daily. 09/30/15   Cuthriell, Delorise RoyalsJonathan D, PA-C  phenazopyridine (PYRIDIUM) 200 MG tablet Take 1 tablet (200 mg total) by mouth 3 (three) times daily as needed for pain. 05/09/18   Triplett, Cari B, FNP  polyethylene glycol powder (MIRALAX) powder Take 255 g by mouth once for 1 dose. 08/15/18 08/15/18  Enid DerryWagner, Natoria Archibald, PA-C    Allergies Sulfa antibiotics  No family history on file.  Social History Social History   Tobacco Use  . Smoking status: Current Every Day Smoker    Packs/day: 0.50  . Smokeless tobacco: Never Used  Substance Use Topics  . Alcohol use: No  . Drug use: No     Review of Systems  Constitutional: No fever/chills Cardiovascular: No chest pain. Respiratory:No SOB. Gastrointestinal: No abdominal pain.  No nausea, no vomiting.  Skin: Negative for rash, abrasions, lacerations, ecchymosis.   ____________________________________________   PHYSICAL EXAM:  VITAL SIGNS: ED Triage Vitals  Enc Vitals Group     BP 08/15/18 1515 (!) 168/77     Pulse Rate 08/15/18 1515 98     Resp 08/15/18 1515 18     Temp 08/15/18 1515 97.8 F (36.6 C)     Temp Source 08/15/18 1515 Oral     SpO2  08/15/18 1515 100 %     Weight --      Height --      Head Circumference --      Peak Flow --      Pain Score 08/15/18 1540 6     Pain Loc --      Pain Edu? --      Excl. in GC? --      Constitutional: Alert and oriented. Well appearing and in no acute distress. Eyes: Conjunctivae are normal. PERRL. EOMI. Head: Atraumatic. ENT:      Ears:      Nose: No congestion/rhinnorhea.      Mouth/Throat: Mucous membranes are moist.  Neck: No stridor.   Cardiovascular: Normal rate, regular rhythm.  Good  peripheral circulation. Respiratory: Normal respiratory effort without tachypnea or retractions. Lungs CTAB. Good air entry to the bases with no decreased or absent breath sounds. Gastrointestinal: Bowel sounds 4 quadrants. Soft and nontender to palpation. No guarding or rigidity. No palpable masses. No distention. No CVA tenderness. Musculoskeletal: Full range of motion to all extremities. No gross deformities appreciated. Neurologic:  Normal speech and language. No gross focal neurologic deficits are appreciated.  Skin:  Skin is warm, dry and intact. No rash noted. Psychiatric: Mood and affect are normal. Speech and behavior are normal. Patient exhibits appropriate insight and judgement.   ____________________________________________   LABS (all labs ordered are listed, but only abnormal results are displayed)  Labs Reviewed  URINALYSIS, COMPLETE (UACMP) WITH MICROSCOPIC - Abnormal; Notable for the following components:      Result Value   Color, Urine YELLOW (*)    APPearance TURBID (*)    Hgb urine dipstick MODERATE (*)    Protein, ur 100 (*)    Leukocytes, UA MODERATE (*)    WBC, UA >50 (*)    All other components within normal limits  URINE CULTURE   ____________________________________________  EKG   ____________________________________________  RADIOLOGY Lexine Baton, personally viewed and evaluated these images (plain radiographs) as part of my medical decision making, as well as reviewing the written report by the radiologist.  Dg Abdomen 1 View  Result Date: 08/15/2018 CLINICAL DATA:  UTI, low back pain, no bowel movement in a week. EXAM: ABDOMEN - 1 VIEW COMPARISON:  None. FINDINGS: Overall bowel gas pattern is nonobstructive. Moderate amount of stool throughout the grossly nondistended colon. No evidence of soft tissue mass or abnormal fluid collection. No evidence of free intraperitoneal air. Lung bases appear clear. Visualized osseous structures are  unremarkable. IMPRESSION: Nonobstructive bowel gas pattern. Moderate amount of stool throughout the colon. Electronically Signed   By: Bary Richard M.D.   On: 08/15/2018 15:58    ____________________________________________    PROCEDURES  Procedure(s) performed:    Procedures    Medications  magnesium citrate solution 1 Bottle (1 Bottle Oral Given 08/15/18 1618)  docusate (COLACE) 50 MG/5ML liquid 50 mg (50 mg Oral Given 08/15/18 1616)     ____________________________________________   INITIAL IMPRESSION / ASSESSMENT AND PLAN / ED COURSE  Pertinent labs & imaging results that were available during my care of the patient were reviewed by me and considered in my medical decision making (see chart for details).  Review of the [place] CSRS was performed in accordance of the [facility] prior to dispensing any controlled drugs.     Patient's diagnosis is consistent with UTI and constipation.  Vital signs.  X-ray shows nonobstructive bowel gas pattern with moderate stool.  Abdomen is soft and  nontender.  Patient preferred enema over oral medications.  Enema was given and patient had a small bowel movement after.  Urine consistent with infection.  Urine will be sent for culture.  IM ceftriaxone was given.  Patient appears extremely well and is very talkative.  Patient will be discharged home with prescriptions for Keflex and MiraLAX. Patient is to follow up with primary care as directed. Patient is given ED precautions to return to the ED for any worsening or new symptoms.     ____________________________________________  FINAL CLINICAL IMPRESSION(S) / ED DIAGNOSES  Final diagnoses:  Constipation, unspecified constipation type  Acute cystitis with hematuria      NEW MEDICATIONS STARTED DURING THIS VISIT:  ED Discharge Orders         Ordered    polyethylene glycol powder (MIRALAX) powder   Once     08/15/18 1809    cephALEXin (KEFLEX) 500 MG capsule  3 times daily     08/15/18  1809              This chart was dictated using voice recognition software/Dragon. Despite best efforts to proofread, errors can occur which can change the meaning. Any change was purely unintentional.    Enid Derry, PA-C 08/15/18 2246    Jene Every, MD 08/15/18 2246

## 2018-08-15 NOTE — ED Notes (Signed)
Urine very cloudy with sediment.

## 2018-08-17 LAB — URINE CULTURE: Culture: >=100000 — AB

## 2018-09-08 ENCOUNTER — Other Ambulatory Visit: Payer: Self-pay

## 2018-09-08 ENCOUNTER — Emergency Department
Admission: EM | Admit: 2018-09-08 | Discharge: 2018-09-08 | Disposition: A | Discharge status: Home / Self Care | Payer: 59 | Attending: Emergency Medicine | Admitting: Emergency Medicine

## 2018-09-08 ENCOUNTER — Encounter: Payer: Self-pay | Admitting: Emergency Medicine

## 2018-09-08 DIAGNOSIS — F17200 Nicotine dependence, unspecified, uncomplicated: Secondary | ICD-10-CM | POA: Diagnosis not present

## 2018-09-08 DIAGNOSIS — N10 Acute tubulo-interstitial nephritis: Secondary | ICD-10-CM | POA: Insufficient documentation

## 2018-09-08 DIAGNOSIS — N12 Tubulo-interstitial nephritis, not specified as acute or chronic: Secondary | ICD-10-CM

## 2018-09-08 DIAGNOSIS — E119 Type 2 diabetes mellitus without complications: Secondary | ICD-10-CM | POA: Insufficient documentation

## 2018-09-08 DIAGNOSIS — R1032 Left lower quadrant pain: Secondary | ICD-10-CM | POA: Diagnosis present

## 2018-09-08 LAB — COMPREHENSIVE METABOLIC PANEL
ALBUMIN: 4.1 g/dL (ref 3.5–5.0)
ALT: 14 U/L (ref 0–44)
AST: 15 U/L (ref 15–41)
Alkaline Phosphatase: 122 U/L (ref 38–126)
Anion gap: 8 (ref 5–15)
BUN: 13 mg/dL (ref 6–20)
CALCIUM: 8.9 mg/dL (ref 8.9–10.3)
CO2: 23 mmol/L (ref 22–32)
CREATININE: 0.62 mg/dL (ref 0.44–1.00)
Chloride: 102 mmol/L (ref 98–111)
GFR calc Af Amer: >60 mL/min (ref >60)
GFR calc non Af Amer: >60 mL/min (ref >60)
GLUCOSE: 192 mg/dL — AB (ref 70–99)
Potassium: 3.7 mmol/L (ref 3.5–5.1)
SODIUM: 133 mmol/L — AB (ref 135–145)
Total Bilirubin: 0.6 mg/dL (ref 0.3–1.2)
Total Protein: 8.1 g/dL (ref 6.5–8.1)

## 2018-09-08 LAB — URINALYSIS, COMPLETE (UACMP) WITH MICROSCOPIC
Bilirubin Urine: NEGATIVE
Glucose, UA: NEGATIVE mg/dL
Ketones, ur: NEGATIVE mg/dL
Nitrite: NEGATIVE
RBC / HPF: >50 RBC/hpf — ABNORMAL HIGH (ref 0–5)
SPECIFIC GRAVITY, URINE: 1.015 (ref 1.005–1.030)
pH: 6 (ref 5.0–8.0)

## 2018-09-08 LAB — CBC
HEMATOCRIT: 44 % (ref 36.0–46.0)
HEMOGLOBIN: 13.7 g/dL (ref 12.0–15.0)
MCH: 29.3 pg (ref 26.0–34.0)
MCHC: 31.1 g/dL (ref 30.0–36.0)
MCV: 94.2 fL (ref 80.0–100.0)
NRBC: 0 % (ref 0.0–0.2)
Platelets: 312 10*3/uL (ref 150–400)
RBC: 4.67 MIL/uL (ref 3.87–5.11)
RDW: 14.5 % (ref 11.5–15.5)
WBC: 13.6 10*3/uL — AB (ref 4.0–10.5)

## 2018-09-08 MED ORDER — CEFTRIAXONE SODIUM 1 G IJ SOLR
1.0000 g | Freq: Once | INTRAMUSCULAR | Status: AC
  Administered 2018-09-08: 1 g via INTRAMUSCULAR
  Filled 2018-09-08: qty 10

## 2018-09-08 MED ORDER — ONDANSETRON 4 MG PO TBDP: 4.0000 mg | ORAL_TABLET | Freq: Three times a day (TID) | ORAL | 0 refills | Status: DC | PRN

## 2018-09-08 MED ORDER — ONDANSETRON 4 MG PO TBDP
4.0000 mg | ORAL_TABLET | Freq: Once | ORAL | Status: AC
  Administered 2018-09-08: 4 mg via ORAL
  Filled 2018-09-08: qty 1

## 2018-09-08 MED ORDER — CIPROFLOXACIN HCL 500 MG PO TABS: 500.0000 mg | ORAL_TABLET | Freq: Two times a day (BID) | ORAL | 0 refills | Status: AC

## 2018-09-08 NOTE — ED Triage Notes (Signed)
L flank pain started today. States has had dysuria for about 3 weeks, states took antibiotic during that time.

## 2018-09-08 NOTE — ED Provider Notes (Signed)
[facility] Emergency Department Provider Note   ____________________________________________    I have reviewed the triage vital signs and the nursing notes.   HISTORY  Chief Complaint Flank Pain     HPI Susan Munoz is a 47 y.o. female with a history of diabetes who presents with complaints of left flank pain/dysuria.  Patient reports she started feeling dysuria 2 to 3 days ago which has gradually worsened.  Now she reports her left mid back is sore today.  She denies fevers or chills.  Mild nausea but she thinks this may be because she has not eaten.  Treated for UTI successfully 3 weeks ago.  No abdominal pain.  Has not taken anything for this today.   Past Medical History:  Diagnosis Date  . Diabetes mellitus without complication (HCC)   . Self-catheterizes urinary bladder     There are no active problems to display for this patient.   Past Surgical History:  Procedure Laterality Date  . ABDOMINAL HYSTERECTOMY      Prior to Admission medications   Medication Sig Start Date End Date Taking? Authorizing Provider  Catheters [facility](GENTLECATH URINARY CATHETER) MISC 1 Units by Does not apply route as needed. Use as needed for self-catheterization for urinary output 04/18/18   Cuthriell, Delorise RoyalsJonathan D, PA-C  cetirizine (ZYRTEC) 10 MG tablet Take 1 tablet (10 mg total) by mouth daily. 09/30/15   Cuthriell, Delorise RoyalsJonathan D, PA-C  ciprofloxacin (CILOXAN) 0.3 % ophthalmic solution Place 2 drops into the right ear every 4 (four) hours while awake. 11/06/16   Triplett, Rulon Eisenmengerari B, FNP  ciprofloxacin (CIPRO) 500 MG tablet Take 1 tablet (500 mg total) by mouth 2 (two) times daily for 10 days. 09/08/18 09/18/18  Jene EveryKinner, Mylynn Dinh, MD  doxycycline (VIBRAMYCIN) 100 MG capsule Take 1 capsule (100 mg total) by mouth 2 (two) times daily. 11/06/16   Triplett, Rulon Eisenmengerari B, FNP  fluticasone (FLONASE) 50 MCG/ACT nasal spray Place 1 spray into both nostrils 2 (two) times daily. 09/30/15    Cuthriell, Delorise RoyalsJonathan D, PA-C  ondansetron (ZOFRAN ODT) 4 MG disintegrating tablet Take 1 tablet (4 mg total) by mouth every 8 (eight) hours as needed for nausea or vomiting. 09/08/18   Jene EveryKinner, Annah Jasko, MD  phenazopyridine (PYRIDIUM) 200 MG tablet Take 1 tablet (200 mg total) by mouth 3 (three) times daily as needed for pain. 05/09/18   Triplett, Rulon Eisenmengerari B, FNP     Allergies Sulfa antibiotics  No family history on file.  Social History Social History   Tobacco Use  . Smoking status: Current Every Day Smoker    Packs/day: 0.50  . Smokeless tobacco: Never Used  Substance Use Topics  . Alcohol use: No  . Drug use: No    Review of Systems  Constitutional: No fever/chills Eyes: No visual changes.  ENT: No sore throat. Cardiovascular: Denies palpitations Respiratory: Denies shortness of breath. Gastrointestinal: As above Genitourinary: As above Musculoskeletal: Negative for back pain. Skin: Negative for rash. Neurological: Negative for headaches   ____________________________________________   PHYSICAL EXAM:  VITAL SIGNS: ED Triage Vitals  Enc Vitals Group     BP 09/08/18 1400 132/80     Pulse Rate 09/08/18 1400 94     Resp 09/08/18 1400 18     Temp 09/08/18 1400 98.2 F (36.8 C)     Temp Source 09/08/18 1400 Oral     SpO2 09/08/18 1400 98 %     Weight 09/08/18 1401 68 kg (150 lb)  Height 09/08/18 1401 1.524 m (5')     Head Circumference --      Peak Flow --      Pain Score 09/08/18 1401 5     Pain Loc --      Pain Edu? --      Excl. in GC? --     Constitutional: Alert and oriented. No acute distress. Pleasant and interactive   Mouth/Throat: Mucous membranes are moist.    Cardiovascular: Normal rate, regular rhythm. Grossly normal heart sounds.  Good peripheral circulation. Respiratory: Normal respiratory effort.  No retractions. Lungs CTAB. Gastrointestinal: Soft and nontender. No distention.  Mild left CVA tenderness Genitourinary: deferred Musculoskeletal:  No lower extremity tenderness nor edema.  Warm and well perfused Neurologic:  Normal speech and language. No gross focal neurologic deficits are appreciated.  Skin:  Skin is warm, dry and intact. No rash noted. Psychiatric: Mood and affect are normal. Speech and behavior are normal.  ____________________________________________   LABS (all labs ordered are listed, but only abnormal results are displayed)  Labs Reviewed  URINALYSIS, COMPLETE (UACMP) WITH MICROSCOPIC - Abnormal; Notable for the following components:      Result Value   Color, Urine YELLOW (*)    APPearance TURBID (*)    Hgb urine dipstick LARGE (*)    Protein, ur >=300 (*)    Leukocytes, UA LARGE (*)    RBC / HPF >50 (*)    WBC, UA >50 (*)    Bacteria, UA MANY (*)    All other components within normal limits  CBC - Abnormal; Notable for the following components:   WBC 13.6 (*)    All other components within normal limits  COMPREHENSIVE METABOLIC PANEL - Abnormal; Notable for the following components:   Sodium 133 (*)    Glucose, Bld 192 (*)    All other components within normal limits   ____________________________________________  EKG  None ____________________________________________  RADIOLOGY   ____________________________________________   PROCEDURES  Procedure(s) performed: No  Procedures   Critical Care performed: No ____________________________________________   INITIAL IMPRESSION / ASSESSMENT AND PLAN / ED COURSE  Pertinent labs & imaging results that were available during my care of the patient were reviewed by me and considered in my medical decision making (see chart for details).  Patient's presentation is consistent with urinary tract infection, likely early pyelonephritis.  Mild elevation white blood cell count, patient is afebrile with normal heart rate.  Urinalysis confirms UTI.  Given gradual onset not suspicious for kidney stone.  Will treat with IM Rocephin, discharged with  Cipro given possible treatment failure with Keflex    ____________________________________________   FINAL CLINICAL IMPRESSION(S) / ED DIAGNOSES  Final diagnoses:  Pyelonephritis        Note:  This document was prepared using Dragon voice recognition software and may include unintentional dictation errors.    Jene Every, MD 09/08/18 [phone]

## 2019-03-19 ENCOUNTER — Other Ambulatory Visit: Payer: Self-pay

## 2019-03-19 ENCOUNTER — Emergency Department
Admission: EM | Admit: 2019-03-19 | Discharge: 2019-03-19 | Disposition: A | Discharge status: Home / Self Care | Payer: 59 | Attending: Emergency Medicine | Admitting: Emergency Medicine

## 2019-03-19 DIAGNOSIS — F17200 Nicotine dependence, unspecified, uncomplicated: Secondary | ICD-10-CM | POA: Diagnosis not present

## 2019-03-19 DIAGNOSIS — E119 Type 2 diabetes mellitus without complications: Secondary | ICD-10-CM | POA: Insufficient documentation

## 2019-03-19 DIAGNOSIS — N390 Urinary tract infection, site not specified: Secondary | ICD-10-CM | POA: Diagnosis not present

## 2019-03-19 DIAGNOSIS — Z79899 Other long term (current) drug therapy: Secondary | ICD-10-CM | POA: Diagnosis not present

## 2019-03-19 DIAGNOSIS — R319 Hematuria, unspecified: Secondary | ICD-10-CM | POA: Diagnosis present

## 2019-03-19 LAB — URINALYSIS, COMPLETE (UACMP) WITH MICROSCOPIC
Bilirubin Urine: NEGATIVE
Glucose, UA: NEGATIVE mg/dL
Ketones, ur: NEGATIVE mg/dL
Nitrite: NEGATIVE
Protein, ur: >=300 mg/dL — AB
RBC / HPF: >50 RBC/hpf — ABNORMAL HIGH (ref 0–5)
Specific Gravity, Urine: 1.012 (ref 1.005–1.030)
WBC, UA: >50 WBC/hpf — ABNORMAL HIGH (ref 0–5)
pH: 7 (ref 5.0–8.0)

## 2019-03-19 MED ORDER — CEPHALEXIN 500 MG PO CAPS: 500.0000 mg | ORAL_CAPSULE | Freq: Three times a day (TID) | ORAL | 0 refills | Status: DC

## 2019-03-19 NOTE — ED Triage Notes (Signed)
Says she has uti and now has blood in urine.

## 2019-03-19 NOTE — ED Triage Notes (Addendum)
Pt c/o lower back pain with painful urination and blood in urine, states this feels like the last UTI she had. Pt self caths

## 2019-03-19 NOTE — ED Notes (Signed)
See triage note  Presents with urinary freq and dysuria    States sx's started couple of days ago

## 2019-03-19 NOTE — ED Provider Notes (Signed)
[facility] Emergency Department Provider Note   ____________________________________________   First MD Initiated Contact with Patient 03/19/19 1334     (approximate)  I have reviewed the triage vital signs and the nursing notes.   HISTORY  Chief Complaint Urinary Frequency   HPI Susan Munoz is a 48 y.o. female presents to the ED with complaint of UTI.  Patient began having painful urination and hematuria.  Patient has history of self caths and has had multiple urinary tract infections in the past.  She denies any fever, chills, nausea or vomiting.  She states her last UTI was 2-3 weeks ago.  She does not know what antibiotic she was prescribed but reports that she did take the entire course of medication.  Currently she rates her pain as 7 out of 10.       Past Medical History:  Diagnosis Date  . Diabetes mellitus without complication ([place])   . Self-catheterizes urinary bladder     There are no active problems to display for this patient.   Past Surgical History:  Procedure Laterality Date  . ABDOMINAL HYSTERECTOMY      Prior to Admission medications   Medication Sig Start Date End Date Taking? Authorizing Provider  Catheters [facility] URINARY CATHETER) MISC 1 Units by Does not apply route as needed. Use as needed for self-catheterization for urinary output 04/18/18   Cuthriell, Charline Bills, PA-C  cephALEXin (KEFLEX) 500 MG capsule Take 1 capsule (500 mg total) by mouth 3 (three) times daily. 03/19/19   Johnn Hai, PA-C  cetirizine (ZYRTEC) 10 MG tablet Take 1 tablet (10 mg total) by mouth daily. 09/30/15   Cuthriell, Charline Bills, PA-C  fluticasone (FLONASE) 50 MCG/ACT nasal spray Place 1 spray into both nostrils 2 (two) times daily. 09/30/15   Cuthriell, Charline Bills, PA-C    Allergies Sulfa antibiotics  No family history on file.  Social History Social History   Tobacco Use  . Smoking status: Current Every Day Smoker   Packs/day: 0.50  . Smokeless tobacco: Never Used  Substance Use Topics  . Alcohol use: No  . Drug use: No    Review of Systems Constitutional: No fever/chills Cardiovascular: Denies chest pain. Respiratory: Denies shortness of breath. Gastrointestinal: No abdominal pain.  No nausea, no vomiting. Genitourinary: Positive dysuria and hematuria. Musculoskeletal: Positive low back pain. Skin: Negative for rash. Neurological: Negative for headaches, focal weakness or numbness. ____________________________________________   PHYSICAL EXAM:  VITAL SIGNS: ED Triage Vitals  Enc Vitals Group     BP 03/19/19 1144 128/87     Pulse --      Resp 03/19/19 1144 17     Temp 03/19/19 1144 98.8 F (37.1 C)     Temp src --      SpO2 03/19/19 1144 98 %     Weight 03/19/19 1148 145 lb (65.8 kg)     Height 03/19/19 1148 5' (1.524 m)     Head Circumference --      Peak Flow --      Pain Score 03/19/19 1147 7     Pain Loc --      Pain Edu? --      Excl. in [place]? --    Constitutional: Alert and oriented. Well appearing and in no acute distress. Eyes: Conjunctivae are normal.  Head: Atraumatic. Neck: No stridor.   Cardiovascular: Normal rate, regular rhythm. Grossly normal heart sounds.  Good peripheral circulation. Respiratory: Normal respiratory effort.  No retractions. Lungs  CTAB. Gastrointestinal: Suprapubic tenderness.  No CVA tenderness. Musculoskeletal: Moves upper and lower extremities without any difficulty.  Normal gait was noted. Neurologic:  Normal speech and language. No gross focal neurologic deficits are appreciated. No gait instability. Skin:  Skin is warm, dry and intact. No rash noted. Psychiatric: Mood and affect are normal. Speech and behavior are normal.  ____________________________________________   LABS (all labs ordered are listed, but only abnormal results are displayed)  Labs Reviewed  URINALYSIS, COMPLETE (UACMP) WITH MICROSCOPIC - Abnormal; Notable for the  following components:      Result Value   Color, Urine YELLOW (*)    APPearance TURBID (*)    Hgb urine dipstick MODERATE (*)    Protein, ur >=300 (*)    Leukocytes,Ua MODERATE (*)    RBC / HPF >50 (*)    WBC, UA >50 (*)    Bacteria, UA MANY (*)    All other components within normal limits  URINE CULTURE    PROCEDURES  Procedure(s) performed (including Critical Care):  Procedures   ____________________________________________   INITIAL IMPRESSION / ASSESSMENT AND PLAN / ED COURSE  As part of my medical decision making, I reviewed the following data within the electronic MEDICAL RECORD NUMBER Notes from prior ED visits and [place] Controlled Substance Database   48 year old female presents to the ED with complaint of UTI with dysuria and hematuria.  Patient has had multiple UTIs with the last one being 2 to 3 weeks ago.  She is unaware of what antibiotic she was placed on but reports that she took the entire prescription.  Patient has a history of self cathing.  A culture was ordered.  At this time patient was placed on Keflex 500 mg 3 times daily for 10 days.  She was also given the name of a urologist to follow-up with if any continued problems.    ____________________________________________   FINAL CLINICAL IMPRESSION(S) / ED DIAGNOSES  Final diagnoses:  Acute urinary tract infection     ED Discharge Orders         Ordered    cephALEXin (KEFLEX) 500 MG capsule  3 times daily     03/19/19 1341           Note:  This document was prepared using Dragon voice recognition software and may include unintentional dictation errors.    Tommi RumpsSummers, Rhonda L, PA-C 03/19/19 1354    Jeanmarie PlantMcShane, James A, MD 03/19/19 [phone]

## 2019-03-19 NOTE — Discharge Instructions (Addendum)
Follow-up with your primary care provider or the urologist listed on your discharge papers.  Also while self cathing be extremely mindful of sterile procedure to reduce the amount of urinary tract infections that you have.  Take all the antibiotics until completely finished.  Increase fluids.

## 2019-03-22 LAB — URINE CULTURE
Culture: >=100000 — AB
Special Requests: NORMAL

## 2019-03-23 NOTE — Progress Notes (Signed)
Discussed urine culture with Dr. Leslye Peer who provided verbal orders to switch antibiotic from Keflex to Ciprofloxacin.   Patient was notified of urine culture. She was instructed to d/c Cephalexin and to start Ciprofloxacin 500 mg Q12H x7 days. Counseled patient on the antibiotic and pt confirmed understanding of new antibiotics. Called prescription into home pharmacy at [facility].   Thank you for allowing pharmacy to be a part of this patient's care.  Kristeen Miss, PharmD Clinical Pharmacist

## 2019-05-10 ENCOUNTER — Encounter: Payer: Self-pay | Admitting: Emergency Medicine

## 2019-05-10 ENCOUNTER — Other Ambulatory Visit: Payer: Self-pay

## 2019-05-10 ENCOUNTER — Emergency Department
Admission: EM | Admit: 2019-05-10 | Discharge: 2019-05-10 | Disposition: A | Discharge status: Home / Self Care | Payer: Worker's Compensation | Attending: Emergency Medicine | Admitting: Emergency Medicine

## 2019-05-10 ENCOUNTER — Emergency Department: Payer: Worker's Compensation

## 2019-05-10 DIAGNOSIS — W010XXA Fall on same level from slipping, tripping and stumbling without subsequent striking against object, initial encounter: Secondary | ICD-10-CM | POA: Diagnosis not present

## 2019-05-10 DIAGNOSIS — Y9259 Other trade areas as the place of occurrence of the external cause: Secondary | ICD-10-CM | POA: Insufficient documentation

## 2019-05-10 DIAGNOSIS — Y9389 Activity, other specified: Secondary | ICD-10-CM | POA: Insufficient documentation

## 2019-05-10 DIAGNOSIS — Z79899 Other long term (current) drug therapy: Secondary | ICD-10-CM | POA: Insufficient documentation

## 2019-05-10 DIAGNOSIS — Y990 Civilian activity done for income or pay: Secondary | ICD-10-CM | POA: Insufficient documentation

## 2019-05-10 DIAGNOSIS — S2231XA Fracture of one rib, right side, initial encounter for closed fracture: Secondary | ICD-10-CM | POA: Insufficient documentation

## 2019-05-10 DIAGNOSIS — F17210 Nicotine dependence, cigarettes, uncomplicated: Secondary | ICD-10-CM | POA: Insufficient documentation

## 2019-05-10 DIAGNOSIS — S299XXA Unspecified injury of thorax, initial encounter: Secondary | ICD-10-CM | POA: Diagnosis present

## 2019-05-10 DIAGNOSIS — W19XXXA Unspecified fall, initial encounter: Secondary | ICD-10-CM

## 2019-05-10 DIAGNOSIS — E119 Type 2 diabetes mellitus without complications: Secondary | ICD-10-CM | POA: Diagnosis not present

## 2019-05-10 LAB — URINALYSIS, COMPLETE (UACMP) WITH MICROSCOPIC
Bilirubin Urine: NEGATIVE
Glucose, UA: NEGATIVE mg/dL
Ketones, ur: NEGATIVE mg/dL
Nitrite: NEGATIVE
Protein, ur: 100 mg/dL — AB
RBC / HPF: >50 RBC/hpf — ABNORMAL HIGH (ref 0–5)
Specific Gravity, Urine: 1.016 (ref 1.005–1.030)
Squamous Epithelial / HPF: NONE SEEN (ref 0–5)
WBC, UA: >50 WBC/hpf — ABNORMAL HIGH (ref 0–5)
pH: 6 (ref 5.0–8.0)

## 2019-05-10 MED ORDER — OXYCODONE-ACETAMINOPHEN 5-325 MG PO TABS
1.0000 | ORAL_TABLET | Freq: Once | ORAL | Status: AC
  Administered 2019-05-10: 1 via ORAL
  Filled 2019-05-10: qty 1

## 2019-05-10 MED ORDER — OXYCODONE-ACETAMINOPHEN 5-325 MG PO TABS: 1.0000 | ORAL_TABLET | Freq: Four times a day (QID) | ORAL | 0 refills | Status: AC | PRN

## 2019-05-10 MED ORDER — ONDANSETRON HCL 4 MG PO TABS: 4.0000 mg | ORAL_TABLET | Freq: Three times a day (TID) | ORAL | 0 refills | Status: DC | PRN

## 2019-05-10 MED ORDER — LEVOFLOXACIN 750 MG PO TABS: 750.0000 mg | ORAL_TABLET | Freq: Every day | ORAL | 0 refills | Status: AC

## 2019-05-10 MED ORDER — ONDANSETRON 4 MG PO TBDP
4.0000 mg | ORAL_TABLET | Freq: Once | ORAL | Status: AC
  Administered 2019-05-10: 4 mg via ORAL
  Filled 2019-05-10: qty 1

## 2019-05-10 NOTE — ED Triage Notes (Signed)
Pt to ED via POV, pt states that she fell at work on Friday. Pt states she tripped over a rug and fell flat out. Pt states that pain has continues to get worse since fall. Pt c/o pain in her right rib area. Pt is in NAD at this time.

## 2019-05-10 NOTE — ED Provider Notes (Signed)
[facility] Emergency Department Provider Note  ____________________________________________  Time seen: Approximately 4:51 PM  I have reviewed the triage vital signs and the nursing notes.   HISTORY  Chief Complaint Fall    HPI Susan Munoz is a 48 y.o. female with a history of diabetes and self-catheterization, presents to the emergency department with right-sided anterior chest wall pain and concern for possible urinary tract infection.  Patient states that she had a mechanical, non-syncopal fall at work after she tripped over a rug.  Patient states that she felt immediate pain along her right lateral ribs underneath her right breast and states that she thought the pain would go away.  Patient states that pain has progressively become worse and she experiences worsening pain with movement, coughing or changes in position.  Patient denies shortness of breath, chest tightness or other chest pain beside right lateral ribs.  Patient states that she has been self cathing less than usual because of the pain.  She denies abdominal pain or new back pain.  No fever at home.  She states "I just know I have a UTI".  Patient has been taking ibuprofen at home.  No other alleviating measures have been attempted.        Past Medical History:  Diagnosis Date  . Diabetes mellitus without complication ([place])   . Self-catheterizes urinary bladder     There are no active problems to display for this patient.   Past Surgical History:  Procedure Laterality Date  . ABDOMINAL HYSTERECTOMY      Prior to Admission medications   Medication Sig Start Date End Date Taking? Authorizing Provider  Catheters [facility] URINARY CATHETER) MISC 1 Units by Does not apply route as needed. Use as needed for self-catheterization for urinary output 04/18/18   Cuthriell, Charline Bills, PA-C  cephALEXin (KEFLEX) 500 MG capsule Take 1 capsule (500 mg total) by mouth 3 (three) times daily. 03/19/19    Johnn Hai, PA-C  cetirizine (ZYRTEC) 10 MG tablet Take 1 tablet (10 mg total) by mouth daily. 09/30/15   Cuthriell, Charline Bills, PA-C  fluticasone (FLONASE) 50 MCG/ACT nasal spray Place 1 spray into both nostrils 2 (two) times daily. 09/30/15   Cuthriell, Charline Bills, PA-C  levofloxacin (LEVAQUIN) 750 MG tablet Take 1 tablet (750 mg total) by mouth daily for 5 days. 05/10/19 05/15/19  Lannie Fields, PA-C  ondansetron (ZOFRAN) 4 MG tablet Take 1 tablet (4 mg total) by mouth every 8 (eight) hours as needed for nausea or vomiting. 05/10/19   Lannie Fields, PA-C  oxyCODONE-acetaminophen (PERCOCET/ROXICET) 5-325 MG tablet Take 1 tablet by mouth every 6 (six) hours as needed for up to 5 days. 05/10/19 05/15/19  Lannie Fields, PA-C    Allergies Sulfa antibiotics  No family history on file.  Social History Social History   Tobacco Use  . Smoking status: Current Every Day Smoker    Packs/day: 0.50  . Smokeless tobacco: Never Used  Substance Use Topics  . Alcohol use: No  . Drug use: No     Review of Systems  Constitutional: No fever/chills Eyes: No visual changes. No discharge ENT: No upper respiratory complaints. Cardiovascular: no chest pain. Respiratory: no cough. No SOB. Gastrointestinal: No abdominal pain.  No nausea, no vomiting.  No diarrhea.  No constipation. Genitourinary: Negative for dysuria. No hematuria Musculoskeletal: Patient has right-sided reproducible anterior chest wall pain. Skin: Negative for rash, abrasions, lacerations, ecchymosis. Neurological: Negative for headaches, focal weakness or numbness.  ____________________________________________   PHYSICAL EXAM:  VITAL SIGNS: ED Triage Vitals  Enc Vitals Group     BP 05/10/19 1540 116/72     Pulse Rate 05/10/19 1538 71     Resp 05/10/19 1538 16     Temp 05/10/19 1540 98.4 F (36.9 C)     Temp Source 05/10/19 1538 Oral     SpO2 05/10/19 1538 98 %     Weight 05/10/19 1538 150 lb (68 kg)     Height  05/10/19 1538 5\' 1"  (1.549 m)     Head Circumference --      Peak Flow --      Pain Score 05/10/19 1538 8     Pain Loc --      Pain Edu? --      Excl. in [place]? --      Constitutional: Alert and oriented. Well appearing and in no acute distress. Eyes: Conjunctivae are normal. PERRL. EOMI. Head: Atraumatic. ENT:      Nose: No congestion/rhinnorhea.      Mouth/Throat: Mucous membranes are moist.  Neck: No stridor.  No cervical spine tenderness to palpation. Hematological/Lymphatic/Immunilogical: No cervical lymphadenopathy.  Cardiovascular: Normal rate, regular rhythm. Normal S1 and S2.  Good peripheral circulation.  Patient has reproducible anterior chest wall pain with palpation over right lateral ribs, 6 through 8.  No ecchymosis over the anterior chest wall.  No crepitus. Respiratory: Normal respiratory effort without tachypnea or retractions. Lungs CTAB. Good air entry to the bases with no decreased or absent breath sounds. Gastrointestinal: Bowel sounds 4 quadrants. Soft and nontender to palpation. No guarding or rigidity. No palpable masses. No distention. No CVA tenderness. Musculoskeletal: Full range of motion to all extremities. No gross deformities appreciated. Neurologic:  Normal speech and language. No gross focal neurologic deficits are appreciated.  Skin:  Skin is warm, dry and intact. No rash noted. Psychiatric: Mood and affect are normal. Speech and behavior are normal. Patient exhibits appropriate insight and judgement.   ____________________________________________   LABS (all labs ordered are listed, but only abnormal results are displayed)  Labs Reviewed  URINALYSIS, COMPLETE (UACMP) WITH MICROSCOPIC - Abnormal; Notable for the following components:      Result Value   Color, Urine YELLOW (*)    APPearance TURBID (*)    Hgb urine dipstick MODERATE (*)    Protein, ur 100 (*)    Leukocytes,Ua LARGE (*)    RBC / HPF >50 (*)    WBC, UA >50 (*)    Bacteria, UA  FEW (*)    All other components within normal limits   ____________________________________________  EKG   ____________________________________________  RADIOLOGY I personally viewed and evaluated these images as part of my medical decision making, as well as reviewing the written report by the radiologist.    Dg Ribs Unilateral W/chest Right  Result Date: 05/10/2019 CLINICAL DATA:  Pain status post fall EXAM: RIGHT RIBS AND CHEST - 3+ VIEW COMPARISON:  None. FINDINGS: There is no pneumothorax. No large pleural effusion. The heart size is mildly enlarged. There is an acute nondisplaced fracture involving the fifth rib anteriorly on the right. IMPRESSION: Acute nondisplaced fracture involving the fifth rib anteriorly on the right. There is no pneumothorax. Electronically Signed   By: Katherine Mantlehristopher  Green M.D.   On: 05/10/2019 17:41    ____________________________________________    PROCEDURES  Procedure(s) performed:    Procedures    Medications  oxyCODONE-acetaminophen (PERCOCET/ROXICET) 5-325 MG per tablet 1 tablet (1 tablet Oral Given  05/10/19 1823)  ondansetron (ZOFRAN-ODT) disintegrating tablet 4 mg (4 mg Oral Given 05/10/19 1823)     ____________________________________________   INITIAL IMPRESSION / ASSESSMENT AND PLAN / ED COURSE  Pertinent labs & imaging results that were available during my care of the patient were reviewed by me and considered in my medical decision making (see chart for details).  Review of the [place] CSRS was performed in accordance of the [facility] prior to dispensing any controlled drugs.           Assessment and plan Fall 48 year old female presents to the emergency department with right-sided anterior chest wall pain after a mechanical, non-syncopal fall.  Vital signs were reassuring in the emergency department.  Patient had reproducible tenderness over right lateral ribs, 6 through 8.  Patient had no suprapubic tenderness on physical exam and  no CVA tenderness.   Differential diagnosis includes pneumothorax, hemopneumothorax, rib fracture, chest wall contusion, cystitis  Chest x-ray reveals no evidence of pneumothorax or hemopneumothorax.  Patient has an acute nondisplaced right fifth rib fracture.  Patient was given a prescription for Percocet and Zofran at discharge.  Urinalysis is also concerning for cystitis with a large amount of leuks and a moderate amount of blood.  Patient was started on Levaquin as she has previously failed treatment with Keflex and has most recently been treated with Cipro.  She has known sulfa allergy.  All patient questions were answered.    ____________________________________________  FINAL CLINICAL IMPRESSION(S) / ED DIAGNOSES  Final diagnoses:  Fall, initial encounter  Closed fracture of one rib of right side, initial encounter      NEW MEDICATIONS STARTED DURING THIS VISIT:  ED Discharge Orders         Ordered    oxyCODONE-acetaminophen (PERCOCET/ROXICET) 5-325 MG tablet  Every 6 hours PRN     05/10/19 1814    ondansetron (ZOFRAN) 4 MG tablet  Every 8 hours PRN     05/10/19 1814    levofloxacin (LEVAQUIN) 750 MG tablet  Daily     05/10/19 1814              This chart was dictated using voice recognition software/Dragon. Despite best efforts to proofread, errors can occur which can change the meaning. Any change was purely unintentional.    Orvil FeilWoods, Starlet Gallentine M, PA-C 05/10/19 1904    Minna AntisPaduchowski, Kevin, MD 05/10/19 2154

## 2019-10-21 ENCOUNTER — Encounter: Payer: Self-pay | Admitting: Emergency Medicine

## 2019-10-21 ENCOUNTER — Other Ambulatory Visit: Payer: Self-pay

## 2019-10-21 ENCOUNTER — Emergency Department
Admission: EM | Admit: 2019-10-21 | Discharge: 2019-10-21 | Disposition: A | Discharge status: Home / Self Care | Payer: 59 | Attending: Emergency Medicine | Admitting: Emergency Medicine

## 2019-10-21 DIAGNOSIS — E119 Type 2 diabetes mellitus without complications: Secondary | ICD-10-CM | POA: Diagnosis not present

## 2019-10-21 DIAGNOSIS — F17200 Nicotine dependence, unspecified, uncomplicated: Secondary | ICD-10-CM | POA: Insufficient documentation

## 2019-10-21 DIAGNOSIS — N3000 Acute cystitis without hematuria: Secondary | ICD-10-CM | POA: Diagnosis not present

## 2019-10-21 DIAGNOSIS — R300 Dysuria: Secondary | ICD-10-CM | POA: Diagnosis present

## 2019-10-21 LAB — URINALYSIS, COMPLETE (UACMP) WITH MICROSCOPIC
Bilirubin Urine: NEGATIVE
Glucose, UA: NEGATIVE mg/dL
Hgb urine dipstick: NEGATIVE
Ketones, ur: NEGATIVE mg/dL
Nitrite: POSITIVE — AB
Protein, ur: >=300 mg/dL — AB
RBC / HPF: >50 RBC/hpf — ABNORMAL HIGH (ref 0–5)
Specific Gravity, Urine: 1.017 (ref 1.005–1.030)
Squamous Epithelial / LPF: >50 — ABNORMAL HIGH (ref 0–5)
WBC, UA: >50 WBC/hpf — ABNORMAL HIGH (ref 0–5)
pH: 8 (ref 5.0–8.0)

## 2019-10-21 MED ORDER — CEPHALEXIN 500 MG PO CAPS: 500.0000 mg | ORAL_CAPSULE | Freq: Three times a day (TID) | ORAL | 0 refills | Status: AC

## 2019-10-21 MED ORDER — CEPHALEXIN 500 MG PO CAPS
500.0000 mg | ORAL_CAPSULE | Freq: Once | ORAL | Status: AC
  Administered 2019-10-21: 14:00:00 500 mg via ORAL
  Filled 2019-10-21: qty 1

## 2019-10-21 MED ORDER — CEPHALEXIN 500 MG PO CAPS: 500.0000 mg | ORAL_CAPSULE | Freq: Three times a day (TID) | ORAL | 0 refills | Status: DC

## 2019-10-21 NOTE — ED Triage Notes (Signed)
Pt states has another UTI. Pt reports urinary urgency, frequency and dysuria for over a week.

## 2019-10-21 NOTE — Discharge Instructions (Signed)
Take the antibiotic as directed. Empty your bladder regularly. Avoid caffienated drinks.

## 2019-10-21 NOTE — ED Provider Notes (Signed)
[facility] Emergency Department Provider Note ____________________________________________  Time seen: 1400  I have reviewed the triage vital signs and the nursing notes.  HISTORY  Chief Complaint  Urinary Frequency, Urinary Urgency, and Dysuria  HPI Susan Munoz is a 49 y.o. female presents her self to the ED for evaluation of a 1 week complaint of urinary frequency, urgency, and dysuria.  Patient denies any frank hematuria or urinary retention.  She also denies any fevers, chills, or vaginal discharge.  She does report some mild pelvic discomfort and some mild left flank pain.  She denies any history of kidney stones or interstitial cystitis for she does report frequent UTIs.  Patient apparently self catheterizes her urinary bladder.  Past Medical History:  Diagnosis Date  . Diabetes mellitus without complication ([facility])   . Self-catheterizes urinary bladder     There are no problems to display for this patient.   Past Surgical History:  Procedure Laterality Date  . ABDOMINAL HYSTERECTOMY      Prior to Admission medications   Medication Sig Start Date End Date Taking? Authorizing Provider  Catheters [facility] URINARY CATHETER) MISC 1 Units by Does not apply route as needed. Use as needed for self-catheterization for urinary output 04/18/18   Cuthriell, Delorise Royals, PA-C  cephALEXin (KEFLEX) 500 MG capsule Take 1 capsule (500 mg total) by mouth 3 (three) times daily for 7 days. 10/21/19 10/28/19  Tommi Rumps, PA-C  cetirizine (ZYRTEC) 10 MG tablet Take 1 tablet (10 mg total) by mouth daily. 09/30/15   Cuthriell, Delorise Royals, PA-C    Allergies Sulfa antibiotics  No family history on file.  Social History Social History   Tobacco Use  . Smoking status: Current Every Day Smoker    Packs/day: 0.50  . Smokeless tobacco: Never Used  Substance Use Topics  . Alcohol use: No  . Drug use: No    Review of Systems  Constitutional: Negative for  fever. Cardiovascular: Negative for chest pain. Respiratory: Negative for shortness of breath. Gastrointestinal: Negative for abdominal pain, vomiting and diarrhea. Genitourinary: Positive for dysuria. Musculoskeletal: Negative for back pain. Skin: Negative for rash. Neurological: Negative for headaches, focal weakness or numbness. ____________________________________________  PHYSICAL EXAM:  VITAL SIGNS: ED Triage Vitals  Enc Vitals Group     BP 10/21/19 1242 (!) 152/87     Pulse Rate 10/21/19 1242 82     Resp 10/21/19 1242 20     Temp 10/21/19 1242 98 F (36.7 C)     Temp Source 10/21/19 1242 Oral     SpO2 10/21/19 1242 97 %     Weight 10/21/19 1240 150 lb (68 kg)     Height 10/21/19 1240 5' (1.524 m)     Head Circumference --      Peak Flow --      Pain Score 10/21/19 1240 8     Pain Loc --      Pain Edu? --      Excl. in [place]? --     Constitutional: Alert and oriented. Well appearing and in no distress. Head: Normocephalic and atraumatic. Eyes: Conjunctivae are normal. Normal extraocular movements Cardiovascular: Normal rate, regular rhythm. Normal distal pulses. Respiratory: Normal respiratory effort. No wheezes/rales/rhonchi. Gastrointestinal: Soft and nontender. No distention. Musculoskeletal: Nontender with normal range of motion in all extremities.  Neurologic:  Normal gait without ataxia. Normal speech and language. No gross focal neurologic deficits are appreciated. Skin:  Skin is warm, dry and intact. No rash noted. ____________________________________________  LABS (pertinent positives/negatives) Labs Reviewed  URINALYSIS, COMPLETE (UACMP) WITH MICROSCOPIC - Abnormal; Notable for the following components:      Result Value   Color, Urine YELLOW (*)    APPearance TURBID (*)    Protein, ur >=300 (*)    Nitrite POSITIVE (*)    Leukocytes,Ua MODERATE (*)    RBC / HPF >50 (*)    WBC, UA >50 (*)    Bacteria, UA MANY (*)    Squamous Epithelial / LPF >50 (*)     All other components within normal limits  ____________________________________________  PROCEDURES  Keflex 500 mg PO  Procedures ____________________________________________  INITIAL IMPRESSION / ASSESSMENT AND PLAN / ED COURSE  Patient with ED evaluation of a 1 week complaint of intermittent dysuria, hematuria, and urinary frequency.  Patient clinical picture is consistent with a probable UTI.  She be treated empirically with Keflex at this time, and is encouraged to follow with primary provider or select a local primary care provider.  She will take the medications as prescribed return to the ED as needed.  Susan Munoz was evaluated in Emergency Department on 10/21/2019 for the symptoms described in the history of present illness. She was evaluated in the context of the global COVID-19 pandemic, which necessitated consideration that the patient might be at risk for infection with the SARS-CoV-2 virus that causes COVID-19. Institutional protocols and algorithms that pertain to the evaluation of patients at risk for COVID-19 are in a state of rapid change based on information released by regulatory bodies including the CDC and federal and state organizations. These policies and algorithms were followed during the patient's care in the ED. ____________________________________________  FINAL CLINICAL IMPRESSION(S) / ED DIAGNOSES  Final diagnoses:  Acute cystitis without hematuria      Carmie End, Dannielle Karvonen, PA-C 10/21/19 1924    Vanessa [place], MD 10/24/19 [phone]

## 2019-12-27 ENCOUNTER — Other Ambulatory Visit: Payer: Self-pay

## 2019-12-27 ENCOUNTER — Emergency Department
Admission: EM | Admit: 2019-12-27 | Discharge: 2019-12-27 | Disposition: A | Discharge status: Home / Self Care | Payer: 59 | Attending: Emergency Medicine | Admitting: Emergency Medicine

## 2019-12-27 DIAGNOSIS — N390 Urinary tract infection, site not specified: Secondary | ICD-10-CM

## 2019-12-27 DIAGNOSIS — Z79899 Other long term (current) drug therapy: Secondary | ICD-10-CM | POA: Insufficient documentation

## 2019-12-27 DIAGNOSIS — L03119 Cellulitis of unspecified part of limb: Secondary | ICD-10-CM

## 2019-12-27 DIAGNOSIS — F17210 Nicotine dependence, cigarettes, uncomplicated: Secondary | ICD-10-CM | POA: Insufficient documentation

## 2019-12-27 DIAGNOSIS — R21 Rash and other nonspecific skin eruption: Secondary | ICD-10-CM | POA: Diagnosis present

## 2019-12-27 DIAGNOSIS — E119 Type 2 diabetes mellitus without complications: Secondary | ICD-10-CM | POA: Diagnosis not present

## 2019-12-27 LAB — CBC WITH DIFFERENTIAL/PLATELET
Abs Immature Granulocytes: 0.03 10*3/uL (ref 0.00–0.07)
Basophils Absolute: 0.1 10*3/uL (ref 0.0–0.1)
Basophils Relative: 1 %
Eosinophils Absolute: 0.1 10*3/uL (ref 0.0–0.5)
Eosinophils Relative: 1 %
HCT: 41.5 % (ref 36.0–46.0)
Hemoglobin: 13.2 g/dL (ref 12.0–15.0)
Immature Granulocytes: 0 %
Lymphocytes Relative: 29 %
Lymphs Abs: 3.1 10*3/uL (ref 0.7–4.0)
MCH: 30.2 pg (ref 26.0–34.0)
MCHC: 31.8 g/dL (ref 30.0–36.0)
MCV: 95 fL (ref 80.0–100.0)
Monocytes Absolute: 0.6 10*3/uL (ref 0.1–1.0)
Monocytes Relative: 6 %
Neutro Abs: 7 10*3/uL (ref 1.7–7.7)
Neutrophils Relative %: 63 %
Platelets: 356 10*3/uL (ref 150–400)
RBC: 4.37 MIL/uL (ref 3.87–5.11)
RDW: 14.2 % (ref 11.5–15.5)
WBC: 10.9 10*3/uL — ABNORMAL HIGH (ref 4.0–10.5)
nRBC: 0 % (ref 0.0–0.2)

## 2019-12-27 LAB — COMPREHENSIVE METABOLIC PANEL
ALT: 15 U/L (ref 0–44)
AST: 13 U/L — ABNORMAL LOW (ref 15–41)
Albumin: 4.3 g/dL (ref 3.5–5.0)
Alkaline Phosphatase: 123 U/L (ref 38–126)
Anion gap: 8 (ref 5–15)
BUN: 13 mg/dL (ref 6–20)
CO2: 28 mmol/L (ref 22–32)
Calcium: 9.3 mg/dL (ref 8.9–10.3)
Chloride: 101 mmol/L (ref 98–111)
Creatinine, Ser: 0.66 mg/dL (ref 0.44–1.00)
GFR calc Af Amer: >60 mL/min (ref >60)
GFR calc non Af Amer: >60 mL/min (ref >60)
Glucose, Bld: 241 mg/dL — ABNORMAL HIGH (ref 70–99)
Potassium: 3.7 mmol/L (ref 3.5–5.1)
Sodium: 137 mmol/L (ref 135–145)
Total Bilirubin: 0.4 mg/dL (ref 0.3–1.2)
Total Protein: 8.2 g/dL — ABNORMAL HIGH (ref 6.5–8.1)

## 2019-12-27 LAB — URINALYSIS, COMPLETE (UACMP) WITH MICROSCOPIC
Bilirubin Urine: NEGATIVE
Glucose, UA: NEGATIVE mg/dL
Ketones, ur: NEGATIVE mg/dL
Nitrite: NEGATIVE
Protein, ur: 30 mg/dL — AB
RBC / HPF: >50 RBC/hpf — ABNORMAL HIGH (ref 0–5)
Specific Gravity, Urine: 1.013 (ref 1.005–1.030)
WBC, UA: >50 WBC/hpf — ABNORMAL HIGH (ref 0–5)
pH: 7 (ref 5.0–8.0)

## 2019-12-27 MED ORDER — CEPHALEXIN 500 MG PO CAPS: 500.0000 mg | ORAL_CAPSULE | Freq: Three times a day (TID) | ORAL | 0 refills | Status: AC

## 2019-12-27 MED ORDER — DOXYCYCLINE HYCLATE 100 MG PO TABS
100.0000 mg | ORAL_TABLET | Freq: Once | ORAL | Status: AC
  Administered 2019-12-27: 17:00:00 100 mg via ORAL
  Filled 2019-12-27: qty 1

## 2019-12-27 MED ORDER — DOXYCYCLINE HYCLATE 100 MG PO TABS: 100.0000 mg | ORAL_TABLET | Freq: Two times a day (BID) | ORAL | 0 refills | Status: DC

## 2019-12-27 MED ORDER — CEPHALEXIN 500 MG PO CAPS
500.0000 mg | ORAL_CAPSULE | Freq: Once | ORAL | Status: AC
  Administered 2019-12-27: 500 mg via ORAL
  Filled 2019-12-27: qty 1
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
## 2019-12-27 NOTE — ED Triage Notes (Signed)
Pt reports "UTI" - pain/burning/pressure with urination and lower back pain  Pt has pustules over hands/arms/legs x2-3 days

## 2020-02-10 ENCOUNTER — Emergency Department: Payer: 59

## 2020-02-10 ENCOUNTER — Other Ambulatory Visit: Payer: Self-pay

## 2020-02-10 ENCOUNTER — Encounter: Payer: Self-pay | Admitting: Emergency Medicine

## 2020-02-10 ENCOUNTER — Observation Stay
Admission: EM | Admit: 2020-02-10 | Discharge: 2020-02-11 | Disposition: A | Discharge status: Home / Self Care | Payer: 59 | Attending: Family Medicine | Admitting: Family Medicine

## 2020-02-10 DIAGNOSIS — Z8249 Family history of ischemic heart disease and other diseases of the circulatory system: Secondary | ICD-10-CM | POA: Insufficient documentation

## 2020-02-10 DIAGNOSIS — I700 Atherosclerosis of aorta: Secondary | ICD-10-CM | POA: Insufficient documentation

## 2020-02-10 DIAGNOSIS — Z87440 Personal history of urinary (tract) infections: Secondary | ICD-10-CM | POA: Insufficient documentation

## 2020-02-10 DIAGNOSIS — N390 Urinary tract infection, site not specified: Secondary | ICD-10-CM | POA: Diagnosis present

## 2020-02-10 DIAGNOSIS — E669 Obesity, unspecified: Secondary | ICD-10-CM | POA: Diagnosis not present

## 2020-02-10 DIAGNOSIS — R300 Dysuria: Secondary | ICD-10-CM

## 2020-02-10 DIAGNOSIS — N1330 Unspecified hydronephrosis: Secondary | ICD-10-CM | POA: Diagnosis not present

## 2020-02-10 DIAGNOSIS — A408 Other streptococcal sepsis: Secondary | ICD-10-CM

## 2020-02-10 DIAGNOSIS — E1169 Type 2 diabetes mellitus with other specified complication: Secondary | ICD-10-CM

## 2020-02-10 DIAGNOSIS — N10 Acute tubulo-interstitial nephritis: Secondary | ICD-10-CM

## 2020-02-10 DIAGNOSIS — Z882 Allergy status to sulfonamides status: Secondary | ICD-10-CM | POA: Insufficient documentation

## 2020-02-10 DIAGNOSIS — Z6830 Body mass index (BMI) 30.0-30.9, adult: Secondary | ICD-10-CM | POA: Diagnosis not present

## 2020-02-10 DIAGNOSIS — R109 Unspecified abdominal pain: Principal | ICD-10-CM | POA: Insufficient documentation

## 2020-02-10 DIAGNOSIS — E119 Type 2 diabetes mellitus without complications: Secondary | ICD-10-CM | POA: Diagnosis not present

## 2020-02-10 DIAGNOSIS — Z20822 Contact with and (suspected) exposure to covid-19: Secondary | ICD-10-CM | POA: Insufficient documentation

## 2020-02-10 DIAGNOSIS — F17210 Nicotine dependence, cigarettes, uncomplicated: Secondary | ICD-10-CM | POA: Diagnosis not present

## 2020-02-10 HISTORY — DX: Urinary tract infection, site not specified: N39.0

## 2020-02-10 LAB — URINALYSIS, COMPLETE (UACMP) WITH MICROSCOPIC
Bilirubin Urine: NEGATIVE
Glucose, UA: NEGATIVE mg/dL
Ketones, ur: NEGATIVE mg/dL
Nitrite: NEGATIVE
Protein, ur: >=300 mg/dL — AB
RBC / HPF: >50 RBC/hpf — ABNORMAL HIGH (ref 0–5)
Specific Gravity, Urine: 1.016 (ref 1.005–1.030)
WBC, UA: >50 WBC/hpf — ABNORMAL HIGH (ref 0–5)
pH: 8 (ref 5.0–8.0)

## 2020-02-10 LAB — BASIC METABOLIC PANEL
Anion gap: 10 (ref 5–15)
BUN: 14 mg/dL (ref 6–20)
CO2: 24 mmol/L (ref 22–32)
Calcium: 9.4 mg/dL (ref 8.9–10.3)
Chloride: 100 mmol/L (ref 98–111)
Creatinine, Ser: 0.71 mg/dL (ref 0.44–1.00)
GFR calc Af Amer: >60 mL/min (ref >60)
GFR calc non Af Amer: >60 mL/min (ref >60)
Glucose, Bld: 226 mg/dL — ABNORMAL HIGH (ref 70–99)
Potassium: 4.2 mmol/L (ref 3.5–5.1)
Sodium: 134 mmol/L — ABNORMAL LOW (ref 135–145)

## 2020-02-10 LAB — CBC
HCT: 43 % (ref 36.0–46.0)
Hemoglobin: 13.9 g/dL (ref 12.0–15.0)
MCH: 30.9 pg (ref 26.0–34.0)
MCHC: 32.3 g/dL (ref 30.0–36.0)
MCV: 95.6 fL (ref 80.0–100.0)
Platelets: 373 10*3/uL (ref 150–400)
RBC: 4.5 MIL/uL (ref 3.87–5.11)
RDW: 14.6 % (ref 11.5–15.5)
WBC: 22.6 10*3/uL — ABNORMAL HIGH (ref 4.0–10.5)
nRBC: 0 % (ref 0.0–0.2)

## 2020-02-10 LAB — LACTIC ACID, PLASMA: Lactic Acid, Venous: 1.1 mmol/L (ref 0.5–1.9)

## 2020-02-10 LAB — SARS CORONAVIRUS 2 BY RT PCR (HOSPITAL ORDER, PERFORMED IN ~~LOC~~ HOSPITAL LAB): SARS Coronavirus 2: NEGATIVE

## 2020-02-10 LAB — GLUCOSE, CAPILLARY: Glucose-Capillary: 284 mg/dL — ABNORMAL HIGH (ref 70–99)

## 2020-02-10 MED ORDER — SODIUM CHLORIDE 0.9 % IV BOLUS
2000.0000 mL | Freq: Once | INTRAVENOUS | Status: AC
  Administered 2020-02-10: 2000 mL via INTRAVENOUS

## 2020-02-10 MED ORDER — FENTANYL CITRATE (PF) 100 MCG/2ML IJ SOLN
50.0000 ug | Freq: Once | INTRAMUSCULAR | Status: AC
  Administered 2020-02-10: 50 ug via INTRAVENOUS
  Filled 2020-02-10: qty 2

## 2020-02-10 MED ORDER — SODIUM CHLORIDE 0.9 % IV SOLN: INTRAVENOUS | Status: DC

## 2020-02-10 MED ORDER — FENTANYL CITRATE (PF) 100 MCG/2ML IJ SOLN
50.0000 ug | INTRAMUSCULAR | Status: DC | PRN
  Administered 2020-02-10: 50 ug via INTRAVENOUS
  Filled 2020-02-10: qty 2

## 2020-02-10 MED ORDER — ONDANSETRON HCL 4 MG/2ML IJ SOLN
4.0000 mg | Freq: Once | INTRAMUSCULAR | Status: AC
  Administered 2020-02-10: 4 mg via INTRAVENOUS
  Filled 2020-02-10: qty 2

## 2020-02-10 MED ORDER — ONDANSETRON HCL 4 MG/2ML IJ SOLN: 4.0000 mg | Freq: Four times a day (QID) | INTRAMUSCULAR | Status: DC | PRN

## 2020-02-10 MED ORDER — ACETAMINOPHEN 325 MG PO TABS
650.0000 mg | ORAL_TABLET | Freq: Four times a day (QID) | ORAL | Status: DC | PRN
  Administered 2020-02-11 (×2): 650 mg via ORAL
  Filled 2020-02-10 (×2): qty 2

## 2020-02-10 MED ORDER — ONDANSETRON HCL 4 MG PO TABS: 4.0000 mg | ORAL_TABLET | Freq: Four times a day (QID) | ORAL | Status: DC | PRN

## 2020-02-10 MED ORDER — SODIUM CHLORIDE 0.9 % IV BOLUS: 1000.0000 mL | Freq: Once | INTRAVENOUS | Status: DC

## 2020-02-10 MED ORDER — NICOTINE 14 MG/24HR TD PT24
14.0000 mg | MEDICATED_PATCH | Freq: Once | TRANSDERMAL | Status: AC
  Administered 2020-02-10: 14 mg via TRANSDERMAL
  Filled 2020-02-10: qty 1

## 2020-02-10 MED ORDER — SODIUM CHLORIDE 0.9 % IV SOLN
2.0000 g | INTRAVENOUS | Status: DC
  Filled 2020-02-10: qty 20

## 2020-02-10 MED ORDER — ENOXAPARIN SODIUM 40 MG/0.4ML ~~LOC~~ SOLN
40.0000 mg | SUBCUTANEOUS | Status: DC
  Administered 2020-02-11: 08:00:00 40 mg via SUBCUTANEOUS
  Filled 2020-02-10: qty 0.4

## 2020-02-10 MED ORDER — SODIUM CHLORIDE 0.9 % IV SOLN
1.0000 g | Freq: Once | INTRAVENOUS | Status: AC
  Administered 2020-02-10: 1 g via INTRAVENOUS
  Filled 2020-02-10: qty 10

## 2020-02-10 MED ORDER — KETOROLAC TROMETHAMINE 30 MG/ML IJ SOLN
30.0000 mg | Freq: Three times a day (TID) | INTRAMUSCULAR | Status: DC | PRN
  Administered 2020-02-10: 30 mg via INTRAVENOUS
  Filled 2020-02-10: qty 1

## 2020-02-10 MED ORDER — INSULIN ASPART 100 UNIT/ML ~~LOC~~ SOLN
0.0000 [IU] | SUBCUTANEOUS | Status: DC
  Administered 2020-02-10: 8 [IU] via SUBCUTANEOUS
  Administered 2020-02-11: 5 [IU] via SUBCUTANEOUS
  Administered 2020-02-11: 3 [IU] via SUBCUTANEOUS
  Administered 2020-02-11: 5 [IU] via SUBCUTANEOUS
  Filled 2020-02-10 (×5): qty 1

## 2020-02-10 MED ORDER — OXYCODONE HCL 5 MG PO TABS: 5.0000 mg | ORAL_TABLET | ORAL | Status: DC | PRN

## 2020-02-10 NOTE — ED Notes (Signed)
Pt transported to CT ?

## 2020-02-10 NOTE — ED Notes (Signed)
Pt was given a diet sprit in a cup with ice.

## 2020-02-10 NOTE — ED Provider Notes (Signed)
CT Renal Stone Study  Result Date: 02/10/2020 CLINICAL DATA:  Flank pain. EXAM: CT ABDOMEN AND PELVIS WITHOUT CONTRAST TECHNIQUE: Multidetector CT imaging of the abdomen and pelvis was performed following the standard protocol without IV contrast. COMPARISON:  None. FINDINGS: Lower chest: No acute abnormality. Hepatobiliary: No focal liver abnormality is seen. No gallstones, gallbladder wall thickening, or biliary dilatation. Pancreas: The tail of the pancreas is not clearly identified. The remaining visualized portions of the pancreas are unremarkable. No pancreatic ductal dilatation or surrounding inflammatory changes. Spleen: Normal in size without focal abnormality. Adrenals/Urinary Tract: Adrenal glands are unremarkable. The right kidney is normal in size, without renal calculi, focal lesion, or hydronephrosis. There is marked severity left-sided hydronephrosis with markedly dilated intrarenal calices. Diffuse renal cortical thinning is seen. Marked severity hydronephrosis is seen along the proximal 2/3 of the left ureter. Diffuse thickening of the ureteral wall is noted. There is marked severity left-sided perinephric and periureteral inflammatory fat stranding. No obstructing renal stones are identified. The urinary bladder is partially contracted and subsequently limited in evaluation. Mild to moderate severity diffuse urinary bladder wall thickening is seen. Stomach/Bowel: Stomach is within normal limits. Appendix appears normal. No evidence of bowel wall thickening, distention, or inflammatory changes. Vascular/Lymphatic: There is mild aortic calcification. No enlarged abdominal or pelvic lymph nodes. Reproductive: Status post hysterectomy. No adnexal masses. Other: Multiple small surgical clips are seen within the lateral aspect of the lower pelvis on the right. No abdominal wall hernia or abnormality. No abdominopelvic ascites. Musculoskeletal: No acute or significant osseous findings. IMPRESSION: 1.  Marked severity left-sided hydronephrosis, hydroureter and intrarenal calyceal dilatation with marked severity perinephric and periureteral inflammatory fat stranding. Given the diffuse thickening of the wall of the proximal to mid left ureter, an obstructing ureteral mass cannot be excluded. Sequelae associated with acute pyelonephritis should also be considered. 2. Mild to moderate severity diffuse urinary bladder wall thickening. While this may be, in part, secondary to the contracted nature of the urinary bladder, sequelae associated with mild cystitis and/or an underlying neoplastic process cannot be excluded. Aortic Atherosclerosis (ICD10-I70.0). Electronically Signed   By: Aram Candela M.D.   On: 02/10/2020 15:56     CT imaging reviewed, concerning for left-sided hydronephrosis.  Multiple findings as above.  Dr. Vanna Scotland has reviewed this, seeing the patient in consult in the ER this evening.  Admission discussed with hospitalist Dr. Maryfrances Bunnell.   Patient be admitted.  Impression Sepsis Pyelonephritis Hydronephrosis, left   Sharyn Creamer, MD 02/10/20 1721

## 2020-02-10 NOTE — ED Provider Notes (Signed)
[facility] Emergency Department Provider Note  ____________________________________________   First MD Initiated Contact with Patient 02/10/20 1432     (approximate)  I have reviewed the triage vital signs and the nursing notes.  History  Chief Complaint Flank Pain and Dysuria    HPI Susan Munoz is a 49 y.o. female with history of diabetes, self catheterizes, recurrent UTIs, who presents to the emergency department with concern for UTI + kidney infection.  Symptoms started 1 week ago and have been constant since onset, progressively worsening.  Patient last seen here at the end of March for UTI, states she completed her antibiotics at that time and felt better.  Last week, she began to feel dysuria and suprapubic discomfort, and now feeling left flank pain as well.  She is concerned for possible kidney infection given her flank pain.  She denies any hematuria.  She does have a history of nephrolithiasis, states this feels differently.  Denies any fevers, but has had nausea and vomiting.  Describes the pain as sharp, located in the left flank and bladder area, 6/10 in severity.  No radiation.  Improved with a dose of fentanyl.  No aggravating components.   Past Medical Hx Past Medical History:  Diagnosis Date  . Diabetes mellitus without complication (HCC)   . Self-catheterizes urinary bladder     Problem List There are no problems to display for this patient.   Past Surgical Hx Past Surgical History:  Procedure Laterality Date  . ABDOMINAL HYSTERECTOMY      Medications Prior to Admission medications   Medication Sig Start Date End Date Taking? Authorizing Provider  Catheters [facility] URINARY CATHETER) MISC 1 Units by Does not apply route as needed. Use as needed for self-catheterization for urinary output 04/18/18   Cuthriell, Delorise Royals, PA-C  cetirizine (ZYRTEC) 10 MG tablet Take 1 tablet (10 mg total) by mouth daily. 09/30/15   Cuthriell,  Delorise Royals, PA-C  doxycycline (VIBRA-TABS) 100 MG tablet Take 1 tablet (100 mg total) by mouth 2 (two) times daily. 12/27/19   Jene Every, MD    Allergies Sulfa antibiotics  Family Hx No family history on file.  Social Hx Social History   Tobacco Use  . Smoking status: Current Every Day Smoker    Packs/day: 0.50  . Smokeless tobacco: Never Used  Substance Use Topics  . Alcohol use: No  . Drug use: No     Review of Systems  Constitutional: Negative for fever. Negative for chills. Eyes: Negative for visual changes. ENT: Negative for sore throat. Cardiovascular: Negative for chest pain. Respiratory: Negative for shortness of breath. Gastrointestinal: Positive for nausea, vomiting. Genitourinary: Positive for dysuria, left flank pain. Musculoskeletal: Negative for leg swelling. Skin: Negative for rash. Neurological: Negative for headaches.   Physical Exam  Vital Signs: ED Triage Vitals  Enc Vitals Group     BP 02/10/20 1410 (!) 108/49     Pulse Rate 02/10/20 1410 (!) 113     Resp 02/10/20 1410 18     Temp 02/10/20 1410 98.1 F (36.7 C)     Temp Source 02/10/20 1410 Oral     SpO2 02/10/20 1410 99 %     Weight 02/10/20 1411 155 lb (70.3 kg)     Height 02/10/20 1411 5' (1.524 m)     Head Circumference --      Peak Flow --      Pain Score 02/10/20 1411 10     Pain Loc --  Pain Edu? --      Excl. in [place]? --     Constitutional: Alert and oriented. NAD.  Head: Normocephalic. Atraumatic. Eyes: Conjunctivae clear. Sclera anicteric. Pupils equal and symmetric. Nose: No masses or lesions. No congestion or rhinorrhea. Mouth/Throat: Wearing mask.  Neck: No stridor. Trachea midline.  Cardiovascular: Tachycardic, regular rhythm. Extremities well perfused. Respiratory: Normal respiratory effort.  Lungs CTAB. Gastrointestinal: Soft. Non-distended.  TTP suprapubically, remainder of abdomen is soft and nontender.  No rebound, guarding, rigidity.  Left CVA  tenderness. Genitourinary: Deferred. Musculoskeletal: No lower extremity edema. No deformities. Neurologic:  Normal speech and language. No gross focal or lateralizing neurologic deficits are appreciated.  Skin: Skin is warm, dry and intact. No rash noted. Psychiatric: Mood and affect are appropriate for situation.   Radiology  CT renal ordered, pending   Procedures  Procedure(s) performed (including critical care):  Procedures   Initial Impression / Assessment and Plan / MDM / ED Course  49 y.o. female with a history of DM, self catheterizes, frequent UTIs who presents to the ED for dysuria, flank pain, vomiting  Ddx: UTI, pyelonephritis, nephrolithiasis  Will plan for labs, urine studies.  Will obtain CT imaging to rule out concomitant stone given flank pain + high suspicion for urinary tract infection.  Labs thus far reveal leukocytosis to 22.6.  Normal creatinine.  She is afebrile, but vitals notable for tachycardia and borderline blood pressure, will obtain lactic acid and blood cultures, IV fluids.  Will order antibiotics as soon I&O urine catheterization is obtained. Patient care transferred to oncoming provider due to shift change, awaiting remainder of labs/urine and imaging.   _______________________________   As part of my medical decision making I have reviewed available labs, radiology tests, reviewed old records/performed chart review.    Final Clinical Impression(s) / ED Diagnosis  Final diagnoses:  Left flank pain  Dysuria       Note:  This document was prepared using Dragon voice recognition software and may include unintentional dictation errors.   Lilia Pro., MD 02/10/20 [phone]

## 2020-02-10 NOTE — H&P (Signed)
History and Physical  Patient Name: Susan Munoz     DQQ:[id number]    DOB: [birth year]    DOA: 02/10/2020 PCP: [facility], [facility]  Patient coming from: Home  Chief Complaint:, Vomiting      HPI: Susan Munoz is a 49 y.o. F with hx obesity, DM, recurrent UTI who self catheterizes who presents with 1 week progressive dysuria, malaise, followed by left flank pain, no vomiting.  Patient has a history of chronic UTIs.  She has never had regular follow-up with urology for this, she is unsure if she is ever had imaging, and mostly describes this as a problem of adulthood.  Her last regular follow-up with urogynecology was at [facility] over 2 years ago.  Since then she gets urinary tract infections about every 3 to 6 months.  She has no history of ESBL in our system.  1 week ago the patient had recurrence of dysuria, urinary urgency.  This progressed until yesterday and today she had nausea, then vomiting, then left leg pain.  So she came to the ER.  In the ER, afebrile, heart rate 113, white count 22.6K.  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  She was given IV fluids, IV fentanyl, and IV ceftriaxone in the hospital service were asked to evaluate.      ROS: Review of Systems  Constitutional: Positive for chills and malaise/fatigue. Negative for fever.  Gastrointestinal: Positive for nausea and vomiting.  Genitourinary: Positive for dysuria, flank pain, frequency and urgency.  All other systems reviewed and are negative.         Past Medical History:  Diagnosis Date  . Diabetes mellitus without complication (HCC)   . Recurrent UTI   . Self-catheterizes urinary bladder     Past Surgical History:  Procedure Laterality Date  . ABDOMINAL HYSTERECTOMY      Social History: Patient lives with her  husband and 2 children.  She works full-time..  The patient walks unassisted.  Smoker, smokes about 1 pack/day.  No alcohol or illicit drugs.    Allergies  Allergen Reactions  . Sulfa Antibiotics Hives    Family history: family history includes Heart attack in her father; Stroke in her mother; Uterine cancer in her sister.  Prior to Admission medications   Medication Sig Start Date End Date Taking? Authorizing Provider  Catheters [facility] URINARY CATHETER) MISC 1 Units by Does not apply route as needed. Use as needed for self-catheterization for urinary output 04/18/18   Cuthriell, Delorise Royals, PA-C  cetirizine (ZYRTEC) 10 MG tablet Take 1 tablet (10 mg total) by mouth daily. 09/30/15   Cuthriell, Delorise Royals, PA-C       Physical Exam: BP 115/78   Pulse (!) 116   Temp 98.1 F (36.7 C) (Oral)   Resp 18   Ht 5' (1.524 m)   Wt 70.3 kg   SpO2 95%   BMI 30.27 kg/m  General appearance: Well-developed, adult female, alert and in mild distress from pain.   Eyes: Anicteric, conjunctiva pink, lids and lashes normal. PERRL.    ENT: No nasal deformity, discharge, epistaxis.  Hearing normal. OP moist without lesions.  Edentulous, lips normal. Skin: Warm and dry.  No jaundice.  No suspicious rashes or lesions. Cardiac: RRR, nl S1-S2, no murmurs appreciated.  Capillary refill is brisk.  JVP not visible.  No LE edema.  Radial pulses 2+ and symmetric. Respiratory: Normal respiratory rate and rhythm.  CTAB without rales or wheezes. Abdomen: Abdomen soft.  Moderate TTP with voluntary guarding, worse in the left lower quadrant. No ascites, distension, hepatosplenomegaly.  Severe left CVA tenderness to palpation. MSK: No deformities or effusions of the large joints of the upper or lower extremities bilaterally.  No cyanosis or clubbing. Neuro: Cranial nerves normal.  Sensation intact to light touch. Speech is fluent.  Muscle strength 5/5 in bilateral upper and lower extremities.    Psych: Sensorium  intact and responding to questions, attention normal.  Behavior appropriate.  Affect normal.  Judgment and insight appear normal.     Labs on Admission:  I have personally reviewed following labs and imaging studies: CBC: Recent Labs  Lab 02/10/20 1418  WBC 22.6*  HGB 13.9  HCT 43.0  MCV 95.6  PLT 373   Basic Metabolic Panel: Recent Labs  Lab 02/10/20 1418  NA 134*  K 4.2  CL 100  CO2 24  GLUCOSE 226*  BUN 14  CREATININE 0.71  CALCIUM 9.4   GFR: Estimated Creatinine Clearance: 74.4 mL/min (by C-G formula based on SCr of 0.71 mg/dL).  Sepsis Labs: Lactic acid 1.1     Radiological Exams on Admission: Personally reviewed CT renal study report, shows severe left hydronephrosis: CT Renal Stone Study  Result Date: 02/10/2020 CLINICAL DATA:  Flank pain. EXAM: CT ABDOMEN AND PELVIS WITHOUT CONTRAST TECHNIQUE: Multidetector CT imaging of the abdomen and pelvis was performed following the standard protocol without IV contrast. COMPARISON:  None. FINDINGS: Lower chest: No acute abnormality. Hepatobiliary: No focal liver abnormality is seen. No gallstones, gallbladder wall thickening, or biliary dilatation. Pancreas: The tail of the pancreas is not clearly identified. The remaining visualized portions of the pancreas are unremarkable. No pancreatic ductal dilatation or surrounding inflammatory changes. Spleen: Normal in size without focal abnormality. Adrenals/Urinary Tract: Adrenal glands are unremarkable. The right kidney is normal in size, without renal calculi, focal lesion, or hydronephrosis. There is marked severity left-sided hydronephrosis with markedly dilated intrarenal calices. Diffuse renal cortical thinning is seen. Marked severity hydronephrosis is seen along the proximal 2/3 of the left ureter. Diffuse thickening of the ureteral wall is noted. There is marked severity left-sided perinephric and periureteral inflammatory fat stranding. No obstructing renal stones are  identified. The urinary bladder is partially contracted and subsequently limited in evaluation. Mild to moderate severity diffuse urinary bladder wall thickening is seen. Stomach/Bowel: Stomach is within normal limits. Appendix appears normal. No evidence of bowel wall thickening, distention, or inflammatory changes. Vascular/Lymphatic: There is mild aortic calcification. No enlarged abdominal or pelvic lymph nodes. Reproductive: Status post hysterectomy. No adnexal masses. Other: Multiple small surgical clips are seen within the lateral aspect of the lower pelvis on the right. No abdominal wall hernia or abnormality. No abdominopelvic ascites. Musculoskeletal: No acute or significant osseous findings. IMPRESSION: 1. Marked severity left-sided hydronephrosis, hydroureter and intrarenal calyceal dilatation with marked severity perinephric and periureteral inflammatory fat stranding. Given the diffuse thickening of the wall of the proximal to mid left ureter, an obstructing ureteral mass cannot be excluded. Sequelae associated with acute pyelonephritis should also be considered. 2. Mild to moderate severity diffuse urinary bladder wall thickening. While this may be, in part, secondary to the contracted nature of the urinary bladder, sequelae associated with mild cystitis and/or an underlying neoplastic process cannot be excluded. Aortic Atherosclerosis (ICD10-I70.0). Electronically Signed   By: Demetrius Revel.D.  On: 02/10/2020 15:56             Assessment/Plan   Acute pyelonephritis with hydronephrosis The timing of this is unclear.  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  We are unsure if she has ever had previous imaging.  Given the tortuosity, simple urethral stenting is not straightforward.  Given the concern for transitional cell carcinoma, PCN tubes are not advisable.  Will attempt medical therapy and  follow up in office to pursue referral for nephrectomy/biopsy. -Insert Foley -IV fluids -N.p.o. at midnight -Continue ceftriaxone -Toradol and oxycodone for analgesia     Diabetes -Check hemoglobin A1c -Sliding scale correction insulin      DVT prophylaxis: Lovenox Code Status: Full code Family Communication: Husband at the bedside Disposition Plan: Anticipate IV fluids, IV antibiotics, follow urine culture.  If she is able to defervesce and transition to oral antibiotics, we may be able to discharge without kidney drainage, and follow-up as an outpatient for definitive therapy over at [facility] or [place].  Consults called: Urology, Dr. Erlene Quan Admission status: Observation   At the point of initial evaluation, it is my clinical opinion that admission for OBSERVATION is reasonable and necessary because the patient's presenting complaints in the context of their chronic conditions represent sufficient risk of deterioration or significant morbidity to constitute reasonable grounds for close observation in the hospital setting, but that the patient may be medically stable for discharge from the hospital within 24 to 48 hours.    Medical decision making: Patient seen at 5:49 PM on 02/10/2020.  The patient was discussed with Dr. Erlene Quan, Dr. Jacqualine Code.  What exists of the patient's chart was reviewed in depth and summarized above.  Clinical condition: Heart rate still 110, but improving, blood pressure and mentation good.        [facility] Please page though [place] or Epic secure chat:  For password, contact charge nurse

## 2020-02-10 NOTE — Consult Note (Signed)
Urology Consult  I have been asked to see the patient by Dr. Fanny Bien, for evaluation and management of left flank pain/hydronephrosis/UTI.  Chief Complaint: Left flank pain  History of Present Illness: Susan Munoz is a 49 y.o. year old female with neurogenic bladder managed on self cath who presents to the emergency room with several weeks of left flank pain that acutely worsened this morning.  She developed associated nausea and vomiting and pain that was unable to be controlled and thus presented to the emergency room.  She denies any fevers or chills.  In the emergency room, she was noted to have an elevated leukocytosis of 18 without an elevated lactate, normal creatinine, afebrile with mild tachycardia responding to fluids and a noncontrast CT scan indicating severe chronic left hydroureteronephrosis down to the level of the pelvis with a severely thickened bladder.  There is extensive perinephric stranding and urothelial thickening both of the bladder and left upper tract.  Patient reports that her mother was told that she possibly had spina bifida as a child.  She underwent some sort of surgical procedure for excision of a "fatty mass" on her spine and that her spine is not all the way it fused.  Ultimately, she reports that she had no further intervention or procedures as she is always functionally normal, able to ambulate, normal bowels and bladder until after childbirth.  Thereafter, she developed fairly significant prolapse and underwent hysterectomy with some sort of bladder tack procedure.  Thereafter, she reports that she developed neurogenic bladder was managed in self cath.  She does recall possibly having some sort of urodynamics but has not had any recent renal imaging or cystoscopic evaluation.  She was referred to urology recently for recurrent urinary tract infections but somehow missed the appointment and now is having trouble getting rescheduled with Dr. Daine Gip.  She  reports that she has had issues with recurrent urinary tract infection.  She was seen a few months ago in our emergency room found to have a UTI.  Fortunately, no urine culture was sent.  She reports that her symptoms have improving since then and she never fully recovered.  Past Medical History:  Diagnosis Date  . Diabetes mellitus without complication (HCC)   . Self-catheterizes urinary bladder     Past Surgical History:  Procedure Laterality Date  . ABDOMINAL HYSTERECTOMY      Home Medications:  No outpatient medications have been marked as taking for the 02/10/20 encounter [facility] Encounter).    Allergies:  Allergies  Allergen Reactions  . Sulfa Antibiotics Hives    No family history on file.  Social History:  reports that she has been smoking. She has been smoking about 0.50 packs per day. She has never used smokeless tobacco. She reports that she does not drink alcohol or use drugs.  Extensive smoking history, started smoking as a young teenager at least 1 pack a day.  ROS: A complete review of systems was performed.  Chronic cough all systems are negative except for pertinent findings as noted.  Physical Exam:  Vital signs in last 24 hours: Temp:  [98.1 F (36.7 C)] 98.1 F (36.7 C) (05/12 1410) Pulse Rate:  [106-116] 116 (05/12 1700) Resp:  [18] 18 (05/12 1410) BP: (108-115)/(49-78) 115/78 (05/12 1501) SpO2:  [94 %-99 %] 95 % (05/12 1700) Weight:  [70.3 kg] 70.3 kg (05/12 1411) Constitutional:  Alert and oriented, No acute distress.  Appears uncomfortable, husband at bedside. HEENT: [place] AT, moist  mucus membranes.  Trachea midline, no masses Cardiovascular: Mild tachycardia GI: Abdomen is soft, nontender, nondistended, no abdominal masses GU: + Left CVA tenderness Skin/ Spine: Midline lower back incision appreciated without any palpable abnormality Neurologic: Grossly intact, no focal deficits, moving all 4 extremities Psychiatric: Normal mood and  affect   Laboratory Data:  Recent Labs    02/10/20 1418  WBC 22.6*  HGB 13.9  HCT 43.0   Recent Labs    02/10/20 1418  NA 134*  K 4.2  CL 100  CO2 24  GLUCOSE 226*  BUN 14  CREATININE 0.71  CALCIUM 9.4   UCx/ BCx pending   Radiologic Imaging: CT Renal Stone Study  Result Date: 02/10/2020 CLINICAL DATA:  Flank pain. EXAM: CT ABDOMEN AND PELVIS WITHOUT CONTRAST TECHNIQUE: Multidetector CT imaging of the abdomen and pelvis was performed following the standard protocol without IV contrast. COMPARISON:  None. FINDINGS: Lower chest: No acute abnormality. Hepatobiliary: No focal liver abnormality is seen. No gallstones, gallbladder wall thickening, or biliary dilatation. Pancreas: The tail of the pancreas is not clearly identified. The remaining visualized portions of the pancreas are unremarkable. No pancreatic ductal dilatation or surrounding inflammatory changes. Spleen: Normal in size without focal abnormality. Adrenals/Urinary Tract: Adrenal glands are unremarkable. The right kidney is normal in size, without renal calculi, focal lesion, or hydronephrosis. There is marked severity left-sided hydronephrosis with markedly dilated intrarenal calices. Diffuse renal cortical thinning is seen. Marked severity hydronephrosis is seen along the proximal 2/3 of the left ureter. Diffuse thickening of the ureteral wall is noted. There is marked severity left-sided perinephric and periureteral inflammatory fat stranding. No obstructing renal stones are identified. The urinary bladder is partially contracted and subsequently limited in evaluation. Mild to moderate severity diffuse urinary bladder wall thickening is seen. Stomach/Bowel: Stomach is within normal limits. Appendix appears normal. No evidence of bowel wall thickening, distention, or inflammatory changes. Vascular/Lymphatic: There is mild aortic calcification. No enlarged abdominal or pelvic lymph nodes. Reproductive: Status post  hysterectomy. No adnexal masses. Other: Multiple small surgical clips are seen within the lateral aspect of the lower pelvis on the right. No abdominal wall hernia or abnormality. No abdominopelvic ascites. Musculoskeletal: No acute or significant osseous findings. IMPRESSION: 1. Marked severity left-sided hydronephrosis, hydroureter and intrarenal calyceal dilatation with marked severity perinephric and periureteral inflammatory fat stranding. Given the diffuse thickening of the wall of the proximal to mid left ureter, an obstructing ureteral mass cannot be excluded. Sequelae associated with acute pyelonephritis should also be considered. 2. Mild to moderate severity diffuse urinary bladder wall thickening. While this may be, in part, secondary to the contracted nature of the urinary bladder, sequelae associated with mild cystitis and/or an underlying neoplastic process cannot be excluded. Aortic Atherosclerosis (ICD10-I70.0). Electronically Signed   By: Virgina Norfolk M.D.   On: 02/10/2020 15:56   Above CT scan was personally reviewed.  Agree with radiologic interpretation.  Extensive review of chart at [facility] was also performed, no recent imaging for comparison.  Impression/Plan: Extremely complex 49 year old female with risk factors including possibly spina bifida and diabetes along with neurogenic bladder presenting with severe left renal unit hydronephrosis, severe atrophy, bladder wall thickening with superimposed infection/pyelonephritis.    Differential diagnosis for severe left hydroureteronephrosis is broad.  If she does in fact have neurogenic high-pressure bladder low capacity, this could be secondary to severe chronic vesicoureteral reflux possibly was secondary obstruction from tortuosity; underlying obstructive malignancy especially given her extensive smoking history; XGP kidney also a strong  possibility given the overall appearance; or even possibly congenital.  We discussed this in depth  today and given the lack of previous imaging, is very difficult at this time to make a more definitive diagnosis although does appear to be a very chronic process.  There is clearly an acute process, likely infectious, on top of this chronic issue.  At this point time, recommended relatively conservative management with IV antibiotics for which she is already received ceftriaxone with pending blood and urine culture.  I also recommended Foley catheter for maximal urinary decompression.  Unfortunately, given the extreme tortuosity of the ureter, I feel that a ureteral stent may not be adequate or even technically possible.  If she decompensates in the form of fevers, hemodynamic changes or continued poorly controlled pain, she would likely benefit more from a PCN.  Would however like to avoid this if possible especially if there is a malignant process such as TCC of the ureter or risk of seeding the tract.  She understands all this.  Ultimately, she will likely need to be referred to a tertiary care center given the complexity of her history for left nephroureterectomy possibly preceded by diagnostic cystoscopy and ureteroscopy to rule out other pathology.  For the time being following recommendations made:  1.  Maximize urinary decompression with Foley catheter (ordered)  2.  Agree with IV antibiotics, broad-spectrum and downgrade as possible based on urine and blood culture  3.  N.p.o. at midnight with reassessment first thing in the morning.  If her leukocytosis feels to improve or her hemodynamics worsen, will consider PCN either emergently overnight or tomorrow if deemed appropriate the point in time after reassessment.  4. Pain management  5.  Urology will continue to follow very closely given the complexity of this patient.  02/10/2020, 5:29 PM  Vanna Scotland,  MD  Case was discussed with Dr. Maryfrances Bunnell as well as to Dr. Fanny Bien.  Thank you for involving me in this patient's care, I will  continue to follow along.Please page with any further questions or concerns. Vanna Scotland

## 2020-02-10 NOTE — ED Triage Notes (Signed)
Says had uti about amonth ago and took antibiotics and was better initially, and then started having dysuria again.  Today she has left flank pain and she started vomiting around noon.  She denies fever.

## 2020-02-11 ENCOUNTER — Encounter: Payer: Self-pay | Admitting: Family Medicine

## 2020-02-11 ENCOUNTER — Other Ambulatory Visit: Payer: Self-pay

## 2020-02-11 DIAGNOSIS — N1330 Unspecified hydronephrosis: Secondary | ICD-10-CM | POA: Diagnosis not present

## 2020-02-11 DIAGNOSIS — N10 Acute tubulo-interstitial nephritis: Secondary | ICD-10-CM | POA: Diagnosis not present

## 2020-02-11 DIAGNOSIS — E1169 Type 2 diabetes mellitus with other specified complication: Secondary | ICD-10-CM | POA: Diagnosis not present

## 2020-02-11 LAB — BASIC METABOLIC PANEL
Anion gap: 7 (ref 5–15)
BUN: 15 mg/dL (ref 6–20)
CO2: 23 mmol/L (ref 22–32)
Calcium: 8.4 mg/dL — ABNORMAL LOW (ref 8.9–10.3)
Chloride: 109 mmol/L (ref 98–111)
Creatinine, Ser: 0.56 mg/dL (ref 0.44–1.00)
GFR calc Af Amer: >60 mL/min (ref >60)
GFR calc non Af Amer: >60 mL/min (ref >60)
Glucose, Bld: 149 mg/dL — ABNORMAL HIGH (ref 70–99)
Potassium: 4 mmol/L (ref 3.5–5.1)
Sodium: 139 mmol/L (ref 135–145)

## 2020-02-11 LAB — CBC
HCT: 35.5 % — ABNORMAL LOW (ref 36.0–46.0)
Hemoglobin: 11.3 g/dL — ABNORMAL LOW (ref 12.0–15.0)
MCH: 30.6 pg (ref 26.0–34.0)
MCHC: 31.8 g/dL (ref 30.0–36.0)
MCV: 96.2 fL (ref 80.0–100.0)
Platelets: 263 10*3/uL (ref 150–400)
RBC: 3.69 MIL/uL — ABNORMAL LOW (ref 3.87–5.11)
RDW: 14.8 % (ref 11.5–15.5)
WBC: 10 10*3/uL (ref 4.0–10.5)
nRBC: 0 % (ref 0.0–0.2)

## 2020-02-11 LAB — GLUCOSE, CAPILLARY
Glucose-Capillary: 113 mg/dL — ABNORMAL HIGH (ref 70–99)
Glucose-Capillary: 132 mg/dL — ABNORMAL HIGH (ref 70–99)
Glucose-Capillary: 164 mg/dL — ABNORMAL HIGH (ref 70–99)
Glucose-Capillary: 215 mg/dL — ABNORMAL HIGH (ref 70–99)
Glucose-Capillary: 242 mg/dL — ABNORMAL HIGH (ref 70–99)

## 2020-02-11 LAB — HEMOGLOBIN A1C
Hgb A1c MFr Bld: 7.6 % — ABNORMAL HIGH (ref 4.8–5.6)
Mean Plasma Glucose: 171.42 mg/dL

## 2020-02-11 LAB — HIV ANTIBODY (ROUTINE TESTING W REFLEX): HIV Screen 4th Generation wRfx: NONREACTIVE

## 2020-02-11 MED ORDER — PNEUMOCOCCAL VAC POLYVALENT 25 MCG/0.5ML IJ INJ: 0.5000 mL | INJECTION | INTRAMUSCULAR | Status: DC

## 2020-02-11 MED ORDER — CHLORHEXIDINE GLUCONATE CLOTH 2 % EX PADS
6.0000 | MEDICATED_PAD | Freq: Every day | CUTANEOUS | Status: DC
  Administered 2020-02-11: 6 via TOPICAL

## 2020-02-11 MED ORDER — CIPROFLOXACIN HCL 500 MG PO TABS
500.0000 mg | ORAL_TABLET | Freq: Two times a day (BID) | ORAL | 0 refills | Status: AC
Start: 2020-02-11 — End: 2020-02-16

## 2020-02-11 MED ORDER — GLIPIZIDE 5 MG PO TABS: 2.5000 mg | ORAL_TABLET | Freq: Two times a day (BID) | ORAL | 11 refills | Status: AC

## 2020-02-11 MED ORDER — BLOOD GLUCOSE MONITOR KIT: PACK | 0 refills | Status: AC

## 2020-02-11 MED ORDER — SODIUM CHLORIDE 0.9 % IV SOLN
2.0000 g | INTRAVENOUS | Status: DC
  Administered 2020-02-11: 2 g via INTRAVENOUS
  Filled 2020-02-11: qty 20

## 2020-02-11 NOTE — Progress Notes (Signed)
Urology Inpatient Progress Note  Subjective: Susan Munoz is a 49 y.o. female with PMH diabetes, possible spina bifida, and neurogenic bladder managed with [facility] admitted on 02/10/2020 with left flank pain and subsequently found to have severe left hydroureteronephrosis with severe left renal atrophy, diffuse bladder wall thickening, and UTI versus pyelonephritis.  She is n.p.o. this morning in anticipation of possible left PCN placement today.  Creatinine down today, 0.56.  WBC count down today, 10.0.  Blood cultures pending with no growth at <24 hours.  Urine culture pending.  On antibiotics as below.  She is afebrile, VSS.  Foley catheter in place draining purulent urine.  Today, patient reports improvement in pain since yesterday.  She states she has a headache but her back pain is significantly improved.  No acute concerns.  Anti-infectives: Anti-infectives (From admission, onward)   Start     Dose/Rate Route Frequency Ordered Stop   02/11/20 1500  cefTRIAXone (ROCEPHIN) 2 g in sodium chloride 0.9 % 100 mL IVPB     2 g 200 mL/hr over 30 Minutes Intravenous Every 24 hours 02/10/20 2111     02/10/20 1600  cefTRIAXone (ROCEPHIN) 1 g in sodium chloride 0.9 % 100 mL IVPB     1 g 200 mL/hr over 30 Minutes Intravenous  Once 02/10/20 1553 02/10/20 1636      Current Facility-Administered Medications  Medication Dose Route Frequency Provider Last Rate Last Admin  . 0.9 %  sodium chloride infusion   Intravenous Continuous Edwin Dada, MD 100 mL/hr at 02/11/20 0449 New Bag at 02/11/20 0449  . acetaminophen (TYLENOL) tablet 650 mg  650 mg Oral Q6H PRN Danford, Suann Larry, MD      . cefTRIAXone (ROCEPHIN) 2 g in sodium chloride 0.9 % 100 mL IVPB  2 g Intravenous Q24H Danford, Suann Larry, MD      . Chlorhexidine Gluconate Cloth 2 % PADS 6 each  6 each Topical Daily Danford, Suann Larry, MD   6 each at 02/11/20 0817  . enoxaparin (LOVENOX) injection 40 mg  40 mg Subcutaneous Q24H  Edwin Dada, MD   40 mg at 02/11/20 0816  . insulin aspart (novoLOG) injection 0-15 Units  0-15 Units Subcutaneous Q4H Edwin Dada, MD   3 Units at 02/11/20 0454  . ketorolac (TORADOL) 30 MG/ML injection 30 mg  30 mg Intravenous Q8H PRN Edwin Dada, MD   30 mg at 02/10/20 2011  . nicotine (NICODERM CQ - dosed in mg/24 hours) patch 14 mg  14 mg Transdermal Once Edwin Dada, MD   14 mg at 02/10/20 1834  . ondansetron (ZOFRAN) tablet 4 mg  4 mg Oral Q6H PRN Danford, Suann Larry, MD       Or  . ondansetron (ZOFRAN) injection 4 mg  4 mg Intravenous Q6H PRN Danford, Suann Larry, MD      . oxyCODONE (Oxy IR/ROXICODONE) immediate release tablet 5 mg  5 mg Oral Q4H PRN Edwin Dada, MD      . Derrill Memo ON 02/12/2020] pneumococcal 23 valent vaccine (PNEUMOVAX-23) injection 0.5 mL  0.5 mL Intramuscular Tomorrow-1000 Danford, Suann Larry, MD         Objective: Vital signs in last 24 hours: Temp:  [98.1 F (36.7 C)-99.1 F (37.3 C)] 98.6 F (37 C) (05/13 0817) Pulse Rate:  [88-118] 88 (05/13 0817) Resp:  [16-19] 18 (05/13 0817) BP: (104-143)/(49-84) 104/69 (05/13 0817) SpO2:  [93 %-99 %] 97 % (05/13 0817) Weight:  [70.3 kg]  70.3 kg (05/12 1411)  Intake/Output from previous day: 05/12 0701 - 05/13 0700 In: 202 [I.V.:202] Out: 350 [Urine:350] Intake/Output this shift: No intake/output data recorded.  Physical Exam Vitals and nursing note reviewed.  Constitutional:      General: She is not in acute distress.    Appearance: She is not ill-appearing, toxic-appearing or diaphoretic.  HENT:     Head: Normocephalic and atraumatic.  Pulmonary:     Effort: Pulmonary effort is normal. No respiratory distress.  Skin:    General: Skin is warm and dry.  Neurological:     Mental Status: She is alert and oriented to person, place, and time.  Psychiatric:        Mood and Affect: Mood normal.        Behavior: Behavior normal.    Lab Results:   Recent Labs    02/10/20 1418 02/11/20 0518  WBC 22.6* 10.0  HGB 13.9 11.3*  HCT 43.0 35.5*  PLT 373 263   BMET Recent Labs    02/10/20 1418 02/11/20 0518  NA 134* 139  K 4.2 4.0  CL 100 109  CO2 24 23  GLUCOSE 226* 149*  BUN 14 15  CREATININE 0.71 0.56  CALCIUM 9.4 8.4*   Assessment & Plan: 48 year old female with a history of diabetes, neurogenic bladder, and possible spina bifida admitted with left flank pain and subsequently found to have severe left hydroureteronephrosis with severe left renal atrophy, diffuse bladder wall thickening, and superimposed infection/pyelonephritis.  Pain significantly improved since yesterday following Foley catheter placement, initiation of antibiotics, and pain control.  We will defer left PCN placement today given that she remains afebrile and her pain is improved.  Okay to advance diet.  We will continue to follow.  Recommendations: -Continue Foley catheter -Continue IV antibiotics and follow urine cultures -Advance diet, no plans for urologic procedure today -Pain management  Carman Ching, PA-C 02/11/2020

## 2020-02-11 NOTE — Progress Notes (Signed)
Pt discharged via private vehicle. Discharge instructions explained and given to pt. Pt educated on how to complete foley catheter care. Pt demonstrated and voiced understanding. Pt also educated on how to use glucometer to check blood sugars. Pt was able to demonstrate understanding of this as well. No further questions at this time. No s/s of distress noted. IV removed.

## 2020-02-11 NOTE — ED Notes (Signed)
Report called to [facility] RN, pt admitted to 101.

## 2020-02-11 NOTE — ED Notes (Signed)
Report received from Chrissy RN. Patient care assumed. Patient/RN introduction complete. Will continue to monitor.  

## 2020-02-11 NOTE — Discharge Summary (Signed)
Physician Discharge Summary  Barrie DunkerJill C Munoz ZOX:[id number] DOB: 07/08/1971 DOA: 02/10/2020  PCP: [facility]  Admit date: 02/10/2020 Discharge date: 02/11/2020  Admitted From: Home Disposition: Home  Recommendations for Outpatient Follow-up:  1. Follow-up with urology, Dr. Apolinar JunesBrandon in 4 days      Home Health: None Equipment/Devices: None  Discharge Condition: Good CODE STATUS: Full Diet recommendation: Diabetic  Brief/Interim Summary: Susan Munoz is a 49 y.o. F with obesity, DM, recurrent UTI who self catheterizes who presents with 1 week progressive dysuria, malaise, followed by left flank pain, no vomiting.  In the ER, afebrile, heart rate 113, white count 22.6K.  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  Urology consulted, no intervention possible initially.  She was given IV fluids, IV fentanyl, and IV ceftriaxone in the hospital service were asked to evaluate.            PRINCIPAL HOSPITAL DIAGNOSIS: Pyelonephritis    Discharge Diagnoses:   Acute pyelonephritis with hydronephrosis History recurrent UTI Patient [facility], had urodynamics or cystoscopy she describes, she is unsure if she is ever had imaging, and mostly describes this as a problem of adulthood.  Her last regular follow-[facility] with [facility]cology [facility].  Since then she gets urinary tract infections about every 3 to 6 months.  She has no history of ESBL in our system.  Here, the chronicity of her hydronephrosis is hard to judge.   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   Given the tortuosity, simple urethral stenting is not straightforward.  Given the concern  for transitional cell carcinoma, PCN tubes are not advisable.  Patient was treated with IV antibiotics IV fluids, and her symptoms improved dramatically.  She was able to tolerate oral intake, her heart rate is normal and she had no further fever.  Urine cultures growing gram-negative rods.  Will discharge on ciprofloxacin 5 more days to complete 7 days therapy.  She will be discharged with Foley catheter in place, and has close urology follow-up in 4 days.      Diabetes Glucoses controlled.  Hemoglobin A1c 7.6% off medication.  Smoking Cessation recommended, modalities discussed.  Anemia, normocytic Likely dilutional/positional           Discharge Instructions  Discharge Instructions    Discharge instructions   Complete by: As directed    From Dr. Maryfrances Bunnellanford: You were admitted for urinary tract infection. Here, we found that your ureter (the tube connecting your kidney to your bladder) was very large and swollen.  For this reason: Take ciprofloxacin 500 mg twice daily for 5 more days starting Friday morning Go see Dr. Apolinar JunesBrandon on Monday (her office has scheduled this appoitment already on Monday, if you need to confirm the date and time, call the number for her office listed below in the To Do section).  Keep the foley catheter in the bladder until that appointment on Monday  I will call you if your urine culture grows anything unusual and we need to change antibiotics Stop smoking  Return to the hospital for worsening pain, fever, vomiting or severe watery diarrhea   Take a probiotic twice daily while on Cipro Take any over the counter probiotic, acidophilus, lactobacilus, or  anything similar.  The pharmacist may be able to guide you  For pain, take acetaminophen 1000 mg (two tabs) up to three times daily Also, you may take ibuprofen 600 mg (three tabs) up to three times daily for 1 week   Increase activity slowly   Complete by: As directed      Allergies as  of 02/11/2020      Reactions   Sulfa Antibiotics Hives      Medication List    TAKE these medications   acetaminophen 500 MG tablet Commonly known as: TYLENOL Take 500-1,000 mg by mouth every 6 (six) hours as needed for mild pain or fever.   ciprofloxacin 500 MG tablet Commonly known as: Cipro Take 1 tablet (500 mg total) by mouth 2 (two) times daily for 5 days.   ibuprofen 200 MG tablet Commonly known as: ADVIL Take 200-600 mg by mouth every 6 (six) hours as needed for fever or mild pain.      Follow-up Information    Vanna Scotland, MD. Schedule an appointment as soon as possible for a visit in 1 week(s).   Specialty: Urology Contact information: [address] [place] [address] [phone]          Allergies  Allergen Reactions  . Sulfa Antibiotics Hives    Consultations:  Urology   Procedures/Studies: CT Renal Stone Study  Result Date: 02/10/2020 CLINICAL DATA:  Flank pain. EXAM: CT ABDOMEN AND PELVIS WITHOUT CONTRAST TECHNIQUE: Multidetector CT imaging of the abdomen and pelvis was performed following the standard protocol without IV contrast. COMPARISON:  None. FINDINGS: Lower chest: No acute abnormality. Hepatobiliary: No focal liver abnormality is seen. No gallstones, gallbladder wall thickening, or biliary dilatation. Pancreas: The tail of the pancreas is not clearly identified. The remaining visualized portions of the pancreas are unremarkable. No pancreatic ductal dilatation or surrounding inflammatory changes. Spleen: Normal in size without focal abnormality. Adrenals/Urinary Tract: Adrenal glands are unremarkable. The right kidney is normal in size, without renal calculi, focal lesion, or hydronephrosis. There is marked severity left-sided hydronephrosis with markedly dilated intrarenal calices. Diffuse renal cortical thinning is seen. Marked severity hydronephrosis is seen along the proximal 2/3 of the left ureter. Diffuse  thickening of the ureteral wall is noted. There is marked severity left-sided perinephric and periureteral inflammatory fat stranding. No obstructing renal stones are identified. The urinary bladder is partially contracted and subsequently limited in evaluation. Mild to moderate severity diffuse urinary bladder wall thickening is seen. Stomach/Bowel: Stomach is within normal limits. Appendix appears normal. No evidence of bowel wall thickening, distention, or inflammatory changes. Vascular/Lymphatic: There is mild aortic calcification. No enlarged abdominal or pelvic lymph nodes. Reproductive: Status post hysterectomy. No adnexal masses. Other: Multiple small surgical clips are seen within the lateral aspect of the lower pelvis on the right. No abdominal wall hernia or abnormality. No abdominopelvic ascites. Musculoskeletal: No acute or significant osseous findings. IMPRESSION: 1. Marked severity left-sided hydronephrosis, hydroureter and intrarenal calyceal dilatation with marked severity perinephric and periureteral inflammatory fat stranding. Given the diffuse thickening of the wall of the proximal to mid left ureter, an obstructing ureteral mass cannot be excluded. Sequelae associated with acute pyelonephritis should also be considered. 2. Mild to moderate severity diffuse urinary bladder wall thickening. While this may be, in part, secondary to the contracted nature of the urinary bladder, sequelae associated with mild cystitis and/or an underlying neoplastic process cannot be excluded. Aortic Atherosclerosis (ICD10-I70.0). Electronically Signed   By: Demetrius Revel.D.  On: 02/10/2020 15:56       Subjective: No nausea, her appetite is good.  No fever overnight.  No confusion.  Her flank pain is improved and mild.  She feels like she has good energy.  No further complaints.  Discharge Exam: Vitals:   02/11/20 0413 02/11/20 0817  BP: (!) 143/68 104/69  Pulse: (!) 101 88  Resp: 19 18  Temp:  99.1 F (37.3 C) 98.6 F (37 C)  SpO2: 95% 97%   Vitals:   02/11/20 0200 02/11/20 0230 02/11/20 0413 02/11/20 0817  BP: 126/83 125/84 (!) 143/68 104/69  Pulse: 100 99 (!) 101 88  Resp: 18  19 18   Temp:   99.1 F (37.3 C) 98.6 F (37 C)  TempSrc:   Oral   SpO2: 96% 97% 95% 97%  Weight:      Height:        General: Pt is alert, awake, not in acute distress Cardiovascular: RRR, nl S1-S2, no murmurs appreciated.   No LE edema.   Respiratory: Normal respiratory rate and rhythm.  CTAB without rales or wheezes. Abdominal: Abdomen soft and non-tender.  No distension or HSM.  CVA tenderness is very mild. Neuro/Psych: Strength symmetric in upper and lower extremities.  Judgment and insight appear normal.   The results of significant diagnostics from this hospitalization (including imaging, microbiology, ancillary and laboratory) are listed below for reference.     Microbiology: Recent Results (from the past 240 hour(s))  Urine culture     Status: Abnormal (Preliminary result)   Collection Time: 02/10/20  2:18 PM   Specimen: Urine, Random  Result Value Ref Range Status   Specimen Description   Final    URINE, RANDOM Performed at [facility], [address], [place], [place]    Special Requests   Final    NONE Performed at [facility], [address] [place], [place]    Culture >=100,000 COLONIES/mL GRAM NEGATIVE RODS (A)  Final   Report Status PENDING  Incomplete  Culture, blood (routine x 2)     Status: None (Preliminary result)   Collection Time: 02/10/20  3:02 PM   Specimen: BLOOD  Result Value Ref Range Status   Specimen Description BLOOD RIGHT ANTECUBITAL  Final   Special Requests   Final    BOTTLES DRAWN AEROBIC AND ANAEROBIC Blood Culture adequate volume   Culture   Final    NO GROWTH < 24 HOURS Performed at [facility], [address] [place], [place]    Report Status PENDING  Incomplete  Culture,  blood (routine x 2)     Status: None (Preliminary result)   Collection Time: 02/10/20  3:02 PM   Specimen: BLOOD  Result Value Ref Range Status   Specimen Description BLOOD BLOOD RIGHT ARM  Final   Special Requests   Final    BOTTLES DRAWN AEROBIC AND ANAEROBIC Blood Culture adequate volume   Culture   Final    NO GROWTH < 24 HOURS Performed at [facility], [address] [place], [place]    Report Status PENDING  Incomplete  SARS Coronavirus 2 by RT PCR (hospital order, performed in [facility] hospital lab) Nasopharyngeal Nasopharyngeal Swab     Status: None   Collection Time: 02/10/20  5:44 PM   Specimen: Nasopharyngeal Swab  Result Value Ref Range Status   SARS Coronavirus 2 NEGATIVE NEGATIVE Final    Comment: (NOTE) SARS-CoV-2 target nucleic acids are  NOT DETECTED. The SARS-CoV-2 RNA is generally detectable in upper and lower respiratory specimens during the acute phase of infection. The lowest concentration of SARS-CoV-2 viral copies this assay can detect is 250 copies / mL. A negative result does not preclude SARS-CoV-2 infection and should not be used as the sole basis for treatment or other patient management decisions.  A negative result may occur with improper specimen collection / handling, submission of specimen other than nasopharyngeal swab, presence of viral mutation(s) within the areas targeted by this assay, and inadequate number of viral copies (<250 copies / mL). A negative result must be combined with clinical observations, patient history, and epidemiological information. Fact Sheet for Patients:   StrictlyIdeas.no Fact Sheet for Healthcare Providers: BankingDealers.co.za This test is not yet approved or cleared  by the [place] FDA and has been authorized for detection and/or diagnosis of SARS-CoV-2 by FDA under an Emergency Use Authorization (EUA).  This EUA will remain in effect  (meaning this test can be used) for the duration of the COVID-19 declaration under Section 564(b)(1) of the Act, 21 U.S.C. section 360bbb-3(b)(1), unless the authorization is terminated or revoked sooner. Performed at [facility], [address] [place], [place] [address]      Labs: BNP (last 3 results) No results for input(s): BNP in the last 8760 hours. Basic Metabolic Panel: Recent Labs  Lab 02/10/20 1418 02/11/20 0518  NA 134* 139  K 4.2 4.0  CL 100 109  CO2 24 23  GLUCOSE 226* 149*  BUN 14 15  CREATININE 0.71 0.56  CALCIUM 9.4 8.4*   Liver Function Tests: No results for input(s): AST, ALT, ALKPHOS, BILITOT, PROT, ALBUMIN in the last 168 hours. No results for input(s): LIPASE, AMYLASE in the last 168 hours. No results for input(s): AMMONIA in the last 168 hours. CBC: Recent Labs  Lab 02/10/20 1418 02/11/20 0518  WBC 22.6* 10.0  HGB 13.9 11.3*  HCT 43.0 35.5*  MCV 95.6 96.2  PLT 373 263   Cardiac Enzymes: No results for input(s): CKTOTAL, CKMB, CKMBINDEX, TROPONINI in the last 168 hours. BNP: Invalid input(s): POCBNP CBG: Recent Labs  Lab 02/10/20 1940 02/11/20 0012 02/11/20 0447 02/11/20 0816 02/11/20 1228  GLUCAP 284* 215* 164* 113* 242*   D-Dimer No results for input(s): DDIMER in the last 72 hours. Hgb A1c Recent Labs    02/11/20 0518  HGBA1C 7.6*   Lipid Profile No results for input(s): CHOL, HDL, LDLCALC, TRIG, CHOLHDL, LDLDIRECT in the last 72 hours. Thyroid function studies No results for input(s): TSH, T4TOTAL, T3FREE, THYROIDAB in the last 72 hours.  Invalid input(s): FREET3 Anemia work up No results for input(s): VITAMINB12, FOLATE, FERRITIN, TIBC, IRON, RETICCTPCT in the last 72 hours. Urinalysis    Component Value Date/Time   COLORURINE AMBER (A) 02/10/2020 1418   APPEARANCEUR TURBID (A) 02/10/2020 1418   APPEARANCEUR Turbid 01/21/2015 1855   LABSPEC 1.016 02/10/2020 1418   LABSPEC 1.015 01/21/2015 1855    PHURINE 8.0 02/10/2020 1418   GLUCOSEU NEGATIVE 02/10/2020 1418   GLUCOSEU Negative 01/21/2015 1855   HGBUR SMALL (A) 02/10/2020 1418   BILIRUBINUR NEGATIVE 02/10/2020 1418   BILIRUBINUR Negative 01/21/2015 1855   KETONESUR NEGATIVE 02/10/2020 1418   PROTEINUR >=300 (A) 02/10/2020 1418   NITRITE NEGATIVE 02/10/2020 1418   LEUKOCYTESUR MODERATE (A) 02/10/2020 1418   LEUKOCYTESUR 3+ 01/21/2015 1855   Sepsis Labs Invalid input(s): PROCALCITONIN,  WBC,  LACTICIDVEN Microbiology Recent Results (from the past 240 hour(s))  Urine culture  Status: Abnormal (Preliminary result)   Collection Time: 02/10/20  2:18 PM   Specimen: Urine, Random  Result Value Ref Range Status   Specimen Description   Final    URINE, RANDOM Performed at [facility], [address] [place], [place] [address]    Special Requests   Final    NONE Performed at [facility], [address] [place], [place] [address]    Culture >=100,000 COLONIES/mL GRAM NEGATIVE RODS (A)  Final   Report Status PENDING  Incomplete  Culture, blood (routine x 2)     Status: None (Preliminary result)   Collection Time: 02/10/20  3:02 PM   Specimen: BLOOD  Result Value Ref Range Status   Specimen Description BLOOD RIGHT ANTECUBITAL  Final   Special Requests   Final    BOTTLES DRAWN AEROBIC AND ANAEROBIC Blood Culture adequate volume   Culture   Final    NO GROWTH < 24 HOURS Performed at [facility], [address] [place], [place] [address]    Report Status PENDING  Incomplete  Culture, blood (routine x 2)     Status: None (Preliminary result)   Collection Time: 02/10/20  3:02 PM   Specimen: BLOOD  Result Value Ref Range Status   Specimen Description BLOOD BLOOD RIGHT ARM  Final   Special Requests   Final    BOTTLES DRAWN AEROBIC AND ANAEROBIC Blood Culture adequate volume   Culture   Final    NO GROWTH < 24 HOURS Performed at [facility], [address] [place], [place]  [address]    Report Status PENDING  Incomplete  SARS Coronavirus 2 by RT PCR (hospital order, performed in [facility] hospital lab) Nasopharyngeal Nasopharyngeal Swab     Status: None   Collection Time: 02/10/20  5:44 PM   Specimen: Nasopharyngeal Swab  Result Value Ref Range Status   SARS Coronavirus 2 NEGATIVE NEGATIVE Final    Comment: (NOTE) SARS-CoV-2 target nucleic acids are NOT DETECTED. The SARS-CoV-2 RNA is generally detectable in upper and lower respiratory specimens during the acute phase of infection. The lowest concentration of SARS-CoV-2 viral copies this assay can detect is 250 copies / mL. A negative result does not preclude SARS-CoV-2 infection and should not be used as the sole basis for treatment or other patient management decisions.  A negative result may occur with improper specimen collection / handling, submission of specimen other than nasopharyngeal swab, presence of viral mutation(s) within the areas targeted by this assay, and inadequate number of viral copies (<250 copies / mL). A negative result must be combined with clinical observations, patient history, and epidemiological information. Fact Sheet for Patients:   BoilerBrush.com.cy Fact Sheet for Healthcare Providers: https://pope.com/ This test is not yet approved or cleared  by the [place] FDA and has been authorized for detection and/or diagnosis of SARS-CoV-2 by FDA under an Emergency Use Authorization (EUA).  This EUA will remain in effect (meaning this test can be used) for the duration of the COVID-19 declaration under Section 564(b)(1) of the Act, 21 U.S.C. section 360bbb-3(b)(1), unless the authorization is terminated or revoked sooner. Performed at [facility], [address] [place], [place] [address]      Time coordinating discharge: 35 minutes The [place] controlled substances registry was reviewed for this patient        SIGNED:   Alberteen Sam, MD  [facility] 02/11/2020, 2:25 PM

## 2020-02-12 LAB — URINE CULTURE: Culture: >=100000 — AB

## 2020-02-15 ENCOUNTER — Ambulatory Visit (INDEPENDENT_AMBULATORY_CARE_PROVIDER_SITE_OTHER): Payer: 59 | Admitting: Physician Assistant

## 2020-02-15 ENCOUNTER — Encounter: Payer: Self-pay | Admitting: Physician Assistant

## 2020-02-15 ENCOUNTER — Other Ambulatory Visit: Payer: Self-pay

## 2020-02-15 VITALS — BP 145/93 | HR 90 | Ht 60.0 in | Wt 155.0 lb

## 2020-02-15 DIAGNOSIS — N1330 Unspecified hydronephrosis: Secondary | ICD-10-CM | POA: Diagnosis not present

## 2020-02-15 LAB — CULTURE, BLOOD (ROUTINE X 2)
Culture: NO GROWTH
Culture: NO GROWTH
Special Requests: ADEQUATE
Special Requests: ADEQUATE

## 2020-02-15 NOTE — Progress Notes (Signed)
02/15/2020 2:29 PM   Susan Munoz [birth year] [id number]  CC: Chief Complaint  Patient presents with  . Urinary Retention    HPI: Susan Munoz is a 49 y.o. female with PMH diabetes, possible spina bifida, and neurogenic bladder managed with [facility] who presents today for hospital follow-up.  She was admitted from 02/10/2020 to 02/11/2020 with left flank pain and subsequently found to have severe left hydroureteronephrosis with severe left renal atrophy, diffuse bladder wall thickening, and UTI versus pyelonephritis.  Foley catheter was placed during admission for maximum urinary decompression.  She was discharged with Foley in place.   Urine culture ultimately resulted with ampicillin, cefazolin, and nitrofurantoin resistant [place]idencia rettgeri.  She received 2 doses of ceftriaxone IV in the hospital and was discharged with 5 days of ciprofloxacin 500 mg twice daily.  She will complete her course of Cipro tomorrow. Blood cultures resulted negative.  Today, patient reports feeling much better since her hospitalization.  She does report some continued mild left flank soreness but otherwise has no acute concerns.  She is tolerating Foley catheter well and verbalizes proper Foley care in clinic today.  PMH: Past Medical History:  Diagnosis Date  . Diabetes mellitus without complication ([place])   . Recurrent UTI   . Self-catheterizes urinary bladder     Surgical History: Past Surgical History:  Procedure Laterality Date  . ABDOMINAL HYSTERECTOMY      Home Medications:  Allergies as of 02/15/2020      Reactions   Sulfa Antibiotics Hives   Nitrofurantoin Rash      Medication List       Accurate as of Feb 15, 2020  2:29 PM. If you have any questions, ask your nurse or doctor.        acetaminophen 500 MG tablet Commonly known as: TYLENOL Take 500-1,000 mg by mouth every 6 (six) hours as needed for mild pain or fever.   blood glucose meter kit and supplies Kit Dispense based on  patient and insurance preference. Use up to four times daily as directed. (FOR ICD-9 250.00, 250.01).   ciprofloxacin 500 MG tablet Commonly known as: Cipro Take 1 tablet (500 mg total) by mouth 2 (two) times daily for 5 days.   glipiZIDE 5 MG tablet Commonly known as: GLUCOTROL Take 0.5 tablets (2.5 mg total) by mouth 2 (two) times daily before a meal.   ibuprofen 200 MG tablet Commonly known as: ADVIL Take 200-600 mg by mouth every 6 (six) hours as needed for fever or mild pain.       Allergies:  Allergies  Allergen Reactions  . Sulfa Antibiotics Hives  . Nitrofurantoin Rash    Family History: Family History  Problem Relation Age of Onset  . Stroke Mother   . Heart attack Father   . Uterine cancer Sister     Social History:   reports that she has been smoking. She has been smoking about 0.50 packs per day. She has never used smokeless tobacco. She reports that she does not drink alcohol or use drugs.  Physical Exam: BP (!) 145/93   Pulse 90   Ht 5' (1.524 m)   Wt 155 lb (70.3 kg)   BMI 30.27 kg/m   Constitutional:  Alert and oriented, no acute distress, nontoxic appearing HEENT: [place], AT Cardiovascular: No clubbing, cyanosis, or edema Respiratory: Normal respiratory effort, no increased work of breathing GU: Foley catheter in place draining clear, yellow urine Skin: No rashes, bruises or suspicious lesions Neurologic: Grossly  intact, no focal deficits, moving all 4 extremities Psychiatric: Normal mood and affect  Assessment & Plan:   1. Hydronephrosis, unspecified hydronephrosis type 49 year old female with PMH diabetes, neurogenic bladder, and possible spina bifida with recent finding of severe left hydroureteronephrosis with severe left renal atrophy, diffuse bladder wall thickening, and superimposed infection/pyelonephritis.  Discharged on culture appropriate antibiotics.  We will plan to keep Foley catheter in place to continue maximal urinary decompression.   As previously explained during her hospitalization, she will require referral to a tertiary care center for further evaluation of her complex urologic picture.  I am placing a referral to [facility] today.  We will schedule her for 4-week catheter exchange in our clinic in case she is unable to be seen by [facility] within this timeframe.  Explained the patient that she can cancel her visit with [place] if she is able to be seen at [facility] in advance of that appointment.  She expressed understanding. - Ambulatory referral to Urology   Return in about 4 weeks (around 03/14/2020) for Catheter exchange.  Debroah Loop, PA-C  [facility] [address] [place], [place] [address] [phone]

## 2020-03-04 ENCOUNTER — Encounter: Payer: Self-pay | Admitting: Physician Assistant

## 2020-03-04 ENCOUNTER — Other Ambulatory Visit: Payer: Self-pay

## 2020-03-04 ENCOUNTER — Ambulatory Visit (INDEPENDENT_AMBULATORY_CARE_PROVIDER_SITE_OTHER): Payer: 59 | Admitting: Physician Assistant

## 2020-03-04 VITALS — BP 114/83 | HR 106 | Temp 97.6°F | Ht 60.0 in | Wt 150.0 lb

## 2020-03-04 DIAGNOSIS — Z466 Encounter for fitting and adjustment of urinary device: Secondary | ICD-10-CM

## 2020-03-04 DIAGNOSIS — M545 Low back pain, unspecified: Secondary | ICD-10-CM

## 2020-03-04 LAB — MICROSCOPIC EXAMINATION: WBC, UA: >30 /hpf — AB (ref 0–5)

## 2020-03-04 LAB — URINALYSIS, COMPLETE
Bilirubin, UA: NEGATIVE
Glucose, UA: NEGATIVE
Ketones, UA: NEGATIVE
Nitrite, UA: POSITIVE — AB
Specific Gravity, UA: 1.015 (ref 1.005–1.030)
Urobilinogen, Ur: 0.2 mg/dL (ref 0.2–1.0)
pH, UA: 6 (ref 5.0–7.5)

## 2020-03-04 MED ORDER — CIPROFLOXACIN HCL 500 MG PO TABS: 500.0000 mg | ORAL_TABLET | Freq: Two times a day (BID) | ORAL | 0 refills | Status: AC

## 2020-03-04 MED ORDER — CIPROFLOXACIN HCL 500 MG PO TABS
500.0000 mg | ORAL_TABLET | Freq: Once | ORAL | Status: AC
  Administered 2020-03-04: 500 mg via ORAL

## 2020-03-04 NOTE — Progress Notes (Signed)
Cath Change/ Replacement  Patient is present today for a catheter change due to urinary retention.  18ml of water was removed from the balloon, a 14FR foley cath was removed without difficulty.  Patient was cleaned and prepped in a sterile fashion with betadine. A plugged 16 FR foley cath was replaced into the bladder no complications were noted. The balloon was filled with 10ml of sterile water. After 10 minutes, a urine sample was obtained from the plugged catheter; urine was noted to be turbid yellow in appearance and the catheter was attached to a leg bag for drainage.  A night bag was also given to the patient and patient was given instruction on how to change from one bag to another. Patient was given proper instruction on catheter care.    Performed by: Carman Ching, PA-C

## 2020-03-04 NOTE — Patient Instructions (Signed)
Indwelling Urinary Catheter Care, Adult An indwelling urinary catheter is a thin, flexible, germ-free (sterile) tube that is placed into the bladder to help drain urine out of the body. The catheter is inserted into the part of the body that drains urine from the bladder (urethra). Urine drains from the catheter into a drainage bag outside of the body. Taking good care of your catheter will keep it working properly and help to prevent problems from developing. What are the risks?  Bacteria may get into your bladder and cause a urinary tract infection.  Urine flow can become blocked. This can happen if the catheter is not working correctly, or if you have sediment or a blood clot in your bladder or the catheter.  Tissue near the catheter may become irritated and bleed. How to wear your catheter and your drainage bag Supplies needed  Adhesive tape or a leg strap.  Alcohol wipe or soap and water (if you use tape).  A clean towel (if you use tape).  Overnight drainage bag.  Smaller drainage bag (leg bag). Wearing your catheter and bag Use adhesive tape or a leg strap to attach your catheter to your leg.  Make sure the catheter is not pulled tight.  If a leg strap gets wet, replace it with a dry one.  If you use adhesive tape: 1. Use an alcohol wipe or soap and water to wash off any stickiness on your skin where you had tape before. 2. Use a clean towel to pat-dry the area. 3. Apply the new tape. You should have received a large overnight drainage bag and a smaller leg bag that fits underneath clothing.  You may wear the overnight bag at any time, but you should not wear the leg bag at night.  Always wear the leg bag below your knee.  Make sure the overnight drainage bag is always lower than the level of your bladder, but do not let it touch the floor. Before you go to sleep, hang the bag inside a wastebasket that is covered by a clean plastic bag. How to care for your skin around  the catheter     Supplies needed  A clean washcloth.  Water and mild soap.  A clean towel. Caring for your skin and catheter  Every day, use a clean washcloth and soapy water to clean the skin around your catheter. 1. Wash your hands with soap and water. 2. Wet a washcloth in warm water and mild soap. 3. Clean the skin around your urethra.  If you are female:  Use one hand to gently spread the folds of skin around your vagina (labia).  With the washcloth in your other hand, wipe the inner side of your labia on each side. Do this in a front-to-back direction.  If you are female:  Use one hand to pull back any skin that covers the end of your penis (foreskin).  With the washcloth in your other hand, wipe your penis in small circles. Start wiping at the tip of your penis, then move outward from the catheter.  Move the foreskin back in place, if this applies. 4. With your free hand, hold the catheter close to where it enters your body. Keep holding the catheter during cleaning so it does not get pulled out. 5. Use your other hand to clean the catheter with the washcloth.  Only wipe downward on the catheter.  Do not wipe upward toward your body, because that may push bacteria into your urethra   and cause infection. 6. Use a clean towel to pat-dry the catheter and the skin around it. Make sure to wipe off all soap. 7. Wash your hands with soap and water.  Shower every day. Do not take baths.  Do not use cream, ointment, or lotion on the area where the catheter enters your body, unless your health care provider tells you to do that.  Do not use powders, sprays, or lotions on your genital area.  Check your skin around the catheter every day for signs of infection. Check for: ? Redness, swelling, or pain. ? Fluid or blood. ? Warmth. ? Pus or a bad smell. How to empty the drainage bag Supplies needed  Rubbing alcohol.  Gauze pad or cotton ball.  Adhesive tape or a leg  strap. Emptying the bag Empty your drainage bag (your overnight drainage bag or your leg bag) when it is ?- full, or at least 2-3 times a day. Clean the drainage bag according to the manufacturer's instructions or as told by your health care provider. 1. Wash your hands with soap and water. 2. Detach the drainage bag from your leg. 3. Hold the drainage bag over the toilet or a clean container. Make sure the drainage bag is lower than your hips and bladder. This stops urine from going back into the tubing and into your bladder. 4. Open the pour spout at the bottom of the bag. 5. Empty the urine into the toilet or container. Do not let the pour spout touch any surface. This precaution is important to prevent bacteria from getting in the bag and causing infection. 6. Apply rubbing alcohol to a gauze pad or cotton ball. 7. Use the gauze pad or cotton ball to clean the pour spout. 8. Close the pour spout. 9. Attach the bag to your leg with adhesive tape or a leg strap. 10. Wash your hands with soap and water. How to change the drainage bag Supplies needed:  Alcohol wipes.  A clean drainage bag.  Adhesive tape or a leg strap. Changing the bag Replace your drainage bag with a clean bag if it leaks, starts to smell bad, or looks dirty. 1. Wash your hands with soap and water. 2. Detach the dirty drainage bag from your leg. 3. Pinch the catheter with your fingers so that urine does not spill out. 4. Disconnect the catheter tube from the drainage tube at the connection valve. Do not let the tubes touch any surface. 5. Clean the end of the catheter tube with an alcohol wipe. Use a different alcohol wipe to clean the end of the drainage tube. 6. Connect the catheter tube to the drainage tube of the clean bag. 7. Attach the clean bag to your leg with adhesive tape or a leg strap. Avoid attaching the new bag too tightly. 8. Wash your hands with soap and water. General instructions   Never pull on  your catheter or try to remove it. Pulling can damage your internal tissues.  Always wash your hands before and after you handle your catheter or drainage bag. Use a mild, fragrance-free soap. If soap and water are not available, use hand sanitizer.  Always make sure there are no twists or bends (kinks) in the catheter tube.  Always make sure there are no leaks in the catheter or drainage bag.  Drink enough fluid to keep your urine pale yellow.  Do not take baths, swim, or use a hot tub.  If you are female, wipe from   front to back after having a bowel movement. Contact a health care provider if:  Your urine is cloudy.  Your urine smells unusually bad.  Your catheter gets clogged.  Your catheter starts to leak.  Your bladder feels full. Get help right away if:  You have redness, swelling, or pain where the catheter enters your body.  You have fluid, blood, pus, or a bad smell coming from the area where the catheter enters your body.  The area where the catheter enters your body feels warm to the touch.  You have a fever.  You have pain in your abdomen, legs, lower back, or bladder.  You see blood in the catheter.  Your urine is pink or red.  You have nausea, vomiting, or chills.  Your urine is not draining into the bag.  Your catheter gets pulled out. Summary  An indwelling urinary catheter is a thin, flexible, germ-free (sterile) tube that is placed into the bladder to help drain urine out of the body.  The catheter is inserted into the part of the body that drains urine from the bladder (urethra).  Take good care of your catheter to keep it working properly and help prevent problems from developing.  Always wash your hands before and after you handle your catheter or drainage bag.  Never pull on your catheter or try to remove it. This information is not intended to replace advice given to you by your health care provider. Make sure you discuss any questions  you have with your health care provider. Document Revised: 01/09/2019 Document Reviewed: 05/03/2017 Elsevier Patient Education  2020 [employer]vier Inc.  

## 2020-03-04 NOTE — Progress Notes (Signed)
03/04/2020 11:16 AM   Susan Munoz 09-Apr-1971 [id number]  CC: Chief Complaint  Patient presents with  . Urinary Tract Infection    HPI: Susan Munoz is a 49 y.o. female with PMH diabetes, possible spina bifida, and neurogenic bladder previously managed with [facility] who was admitted from 02/10/2020 to 02/11/2020 with left flank pain and subsequently found to have severe left hydroureteronephrosis with severe left renal atrophy, diffuse bladder wall thickening, and UTI versus pyelonephritis.  Foley catheter was placed during admission for maximum urinary decompression.  She was discharged with Foley in place and referred to [facility] for further evaluation of her complex urologic picture with likely left nephroureterectomy possibly preceded by diagnostic cystoscopy and ureteroscopy to rule out pathological etiologies of her presentation.  Today, she reports a 1 day history of low back pain, lower abdominal pain, headache, and grossly purulent urine.  She denies fever, chills, nausea, and vomiting.  She has taken ibuprofen for symptom palliation, most recently this morning.  She was previously noted in clinic to have turbid urine, however she states this cleared with her previous course of Cipro but has worsened as of yesterday.  She is due for monthly Foley catheter exchange in clinic next week.  She states she is tolerating her Foley catheter well.  She is wearing a leg bag at all times and states it does get full overnight.  She is concerned about possible urinary reflux from the leg bag into her bladder when she is lying supine.  In-office UA today positive for 3+ blood, 3+ protein, nitrites, and 3+ leukocyte esterase; urine microscopy with >30 WBCs/HPF, 3-10 RBCs/HPF, and moderate bacteria.  PMH: Past Medical History:  Diagnosis Date  . Diabetes mellitus without complication ([place])   . Recurrent UTI   . Self-catheterizes urinary bladder     Surgical History: Past Surgical History:  Procedure  Laterality Date  . ABDOMINAL HYSTERECTOMY      Home Medications:  Allergies as of 03/04/2020      Reactions   Sulfa Antibiotics Hives   Nitrofurantoin Rash      Medication List       Accurate as of March 04, 2020 11:16 AM. If you have any questions, ask your nurse or doctor.        acetaminophen 500 MG tablet Commonly known as: TYLENOL Take 500-1,000 mg by mouth every 6 (six) hours as needed for mild pain or fever.   blood glucose meter kit and supplies Kit Dispense based on patient and insurance preference. Use up to four times daily as directed. (FOR ICD-9 250.00, 250.01).   glipiZIDE 5 MG tablet Commonly known as: GLUCOTROL Take 0.5 tablets (2.5 mg total) by mouth 2 (two) times daily before a meal.   ibuprofen 200 MG tablet Commonly known as: ADVIL Take 200-600 mg by mouth every 6 (six) hours as needed for fever or mild pain.       Allergies:  Allergies  Allergen Reactions  . Sulfa Antibiotics Hives  . Nitrofurantoin Rash    Family History: Family History  Problem Relation Age of Onset  . Stroke Mother   . Heart attack Father   . Uterine cancer Sister     Social History:   reports that she has been smoking. She has been smoking about 0.50 packs per day. She has never used smokeless tobacco. She reports that she does not drink alcohol or use drugs.  Physical Exam: BP 114/83   Pulse (!) 106   Temp 97.6  F (36.4 C) (Tympanic)   Ht 5' (1.524 m)   Wt 150 lb (68 kg)   BMI 29.29 kg/m   Constitutional:  Alert and oriented, no acute distress, nontoxic appearing HEENT: [place], AT Cardiovascular: No clubbing, cyanosis, or edema Respiratory: Normal respiratory effort, no increased work of breathing GU: Foley catheter in place draining grossly purulent urine Skin: No rashes, bruises or suspicious lesions Neurologic: Grossly intact, no focal deficits, moving all 4 extremities Psychiatric: Normal mood and affect  Laboratory Data: Results for orders placed or  performed in visit on 03/04/20  CULTURE, URINE COMPREHENSIVE   Specimen: Urine   UR  Result Value Ref Range   Urine Culture, Comprehensive Preliminary report (A)    Organism ID, Bacteria Gram negative rods (A)   Microscopic Examination   URINE  Result Value Ref Range   WBC, UA >30 (A) 0 - 5 /hpf   RBC 3-10 (A) 0 - 2 /hpf   Epithelial Cells (non renal) 0-10 0 - 10 /hpf   Bacteria, UA Moderate (A) None seen/Few  Urinalysis, Complete  Result Value Ref Range   Specific Gravity, UA 1.015 1.005 - 1.030   pH, UA 6.0 5.0 - 7.5   Color, UA Yellow Yellow   Appearance Ur Cloudy (A) Clear   Leukocytes,UA 3+ (A) Negative   Protein,UA 3+ (A) Negative/Trace   Glucose, UA Negative Negative   Ketones, UA Negative Negative   RBC, UA 3+ (A) Negative   Bilirubin, UA Negative Negative   Urobilinogen, Ur 0.2 0.2 - 1.0 mg/dL   Nitrite, UA Positive (A) Negative   Microscopic Examination See below:    Assessment & Plan:   1. Acute midline low back pain without sciatica 49 year old female with recent diagnosis of severe left hydroureteronephrosis with severe left renal atrophy currently awaiting referral to [facility] for likely left nephroureterectomy currently managed with indwelling Foley catheter presents with a 1 day history of lower abdominal pain, low back pain, headache, and return of grossly purulent urine.  I explained to the patient that cloudy or purulent urine is not an uncommon finding in the setting of an indwelling Foley catheter, however given her reports of lower abdominal and low back pain as well as headache, I am concerned for acute cystitis.  UA grossly infected today; will send for culture for further evaluation.  Will start empiric treatment today: 1 dose Cipro 500 mg p.o administered in clinic with the remaining 7-day course sent to her pharmacy. - ciprofloxacin (CIPRO) tablet 500 mg - Urinalysis, Complete - CULTURE, URINE COMPREHENSIVE - ciprofloxacin (CIPRO) 500 MG tablet; Take 1  tablet (500 mg total) by mouth 2 (two) times daily for 7 days.  Dispense: 13 tablet; Refill: 0   2. Catheter (urine) change required Performed early catheter exchange in clinic today, see separate procedure note for details.  Urine sample obtained from new catheter for urine testing as above.  Reiterated catheter care instructions today, provided her with a night bag for overnight drainage, and instructed patient on how to switch bags.  Return in about 1 month (around 04/03/2020) for cath exchange.  Debroah Loop, PA-C  [facility] [address], [place], [place] [address] [phone]

## 2020-03-10 ENCOUNTER — Telehealth: Payer: Self-pay | Admitting: Physician Assistant

## 2020-03-10 LAB — CULTURE, URINE COMPREHENSIVE

## 2020-03-10 MED ORDER — CEPHALEXIN 500 MG PO CAPS: 500.0000 mg | ORAL_CAPSULE | Freq: Two times a day (BID) | ORAL | 0 refills | Status: AC

## 2020-03-10 NOTE — Telephone Encounter (Signed)
Please contact the patient and inform her that her urine culture has come back resistant to Cipro.  I need to switch her antibiotic.  I have sent Keflex 500 mg twice daily x10 days to the [facility] in [place].  Please counsel her to stop Cipro immediately and start Keflex instead.  I hope she feels better soon.

## 2020-03-10 NOTE — Telephone Encounter (Signed)
Notified patient as advised, patient verbalized understanding.  

## 2020-03-11 ENCOUNTER — Ambulatory Visit: Payer: Self-pay | Admitting: Physician Assistant

## 2020-04-05 ENCOUNTER — Other Ambulatory Visit: Payer: Self-pay

## 2020-04-05 ENCOUNTER — Encounter: Payer: Self-pay | Admitting: Physician Assistant

## 2020-04-05 ENCOUNTER — Ambulatory Visit (INDEPENDENT_AMBULATORY_CARE_PROVIDER_SITE_OTHER): Payer: 59 | Admitting: Physician Assistant

## 2020-04-05 VITALS — BP 161/90 | HR 75 | Ht 60.0 in | Wt 155.7 lb

## 2020-04-05 DIAGNOSIS — Z466 Encounter for fitting and adjustment of urinary device: Secondary | ICD-10-CM | POA: Diagnosis not present

## 2020-04-05 NOTE — Progress Notes (Signed)
Cath Change/ Replacement  Patient is present today for a catheter change due to urinary retention.  76ml of water was removed from the balloon, a 16FR foley cath was removed without difficulty.  Patient was cleaned and prepped in a sterile fashion with betadine. A 16 FR foley cath was replaced into the bladder no complications were noted Urine return was noted 61ml and urine was yellow in color. The balloon was filled with 30ml of sterile water. A leg bag was attached for drainage.  A leg bag, two additional night bags, and two leg straps were also given to the patient and patient was given instruction on how to change from one bag to another. Patient was given proper instruction on catheter care.    Performed by: Carman Ching, PA-C   Additional notes: Patient reports malodorous urine and urinary sediment, is concerned for UTI. She denies lower abdominal pain, low back pain, fever, chills, nausea, or vomiting. Explained her findings are consistent with urinary colonization with indwelling Foley and do not require antibiotics. Counseled her to follow up with [place] if she develops the aforementioned symptoms. She expressed understanding.  Follow up: Return in about 4 weeks (around 05/03/2020) for Catheter exchange.

## 2020-05-03 ENCOUNTER — Other Ambulatory Visit: Payer: Self-pay

## 2020-05-03 ENCOUNTER — Ambulatory Visit (INDEPENDENT_AMBULATORY_CARE_PROVIDER_SITE_OTHER): Payer: 59 | Admitting: Physician Assistant

## 2020-05-03 ENCOUNTER — Encounter: Payer: Self-pay | Admitting: Physician Assistant

## 2020-05-03 VITALS — BP 132/84 | HR 81 | Ht 60.0 in | Wt 149.8 lb

## 2020-05-03 DIAGNOSIS — Z466 Encounter for fitting and adjustment of urinary device: Secondary | ICD-10-CM | POA: Diagnosis not present

## 2020-05-03 NOTE — Progress Notes (Signed)
Cath Change/ Replacement  Patient is present today for a catheter change due to urinary retention.  16ml of water was removed from the balloon, a 16FR silicone foley cath was removed without difficulty.  Patient was cleaned and prepped in a sterile fashion with betadine. A 16 FR foley cath was replaced into the bladder no complications were noted Urine return was noted 80ml and urine was yellow in color. The balloon was filled with 21ml of sterile water. A leg bag was attached for drainage.  Three additional night bags and two additional leg bags were also given to the patient.    Performed by: Carman Ching, PA-C   Follow up: Return in about 4 weeks (around 05/31/2020) for Catheter exchange.

## 2020-06-02 ENCOUNTER — Ambulatory Visit: Payer: Self-pay | Admitting: Physician Assistant

## 2020-06-10 ENCOUNTER — Telehealth: Payer: Self-pay

## 2020-06-10 NOTE — Telephone Encounter (Signed)
Patient left message on voicemail states that he catheter has come out. Called pt back she states that she spoke with her urologist at [facility] (who placed cath) and they advised that she CIC. Pt states she will proceed with that plan.

## 2020-07-05 ENCOUNTER — Encounter: Payer: Self-pay | Admitting: Physician Assistant

## 2020-07-05 ENCOUNTER — Ambulatory Visit (INDEPENDENT_AMBULATORY_CARE_PROVIDER_SITE_OTHER): Payer: 59 | Admitting: Physician Assistant

## 2020-07-05 ENCOUNTER — Other Ambulatory Visit: Payer: Self-pay

## 2020-07-05 VITALS — BP 131/87 | HR 101 | Ht 60.0 in | Wt 150.0 lb

## 2020-07-05 DIAGNOSIS — R3129 Other microscopic hematuria: Secondary | ICD-10-CM

## 2020-07-05 DIAGNOSIS — N390 Urinary tract infection, site not specified: Secondary | ICD-10-CM | POA: Diagnosis not present

## 2020-07-05 LAB — MICROSCOPIC EXAMINATION: WBC, UA: >30 /hpf — AB (ref 0–5)

## 2020-07-05 LAB — URINALYSIS, COMPLETE
Bilirubin, UA: NEGATIVE
Glucose, UA: NEGATIVE
Ketones, UA: NEGATIVE
Nitrite, UA: NEGATIVE
Specific Gravity, UA: 1.015 (ref 1.005–1.030)
Urobilinogen, Ur: 0.2 mg/dL (ref 0.2–1.0)
pH, UA: 7.5 (ref 5.0–7.5)

## 2020-07-05 MED ORDER — CEFUROXIME AXETIL 250 MG PO TABS: 250.0000 mg | ORAL_TABLET | Freq: Two times a day (BID) | ORAL | 0 refills | Status: AC

## 2020-07-05 NOTE — Progress Notes (Signed)
07/05/2020 2:24 PM   Susan Munoz 16-Mar-1971 [id number]  CC: Chief Complaint  Patient presents with  . Recurrent UTI   HPI: Susan Munoz is a 49 y.o. female with PMH diabetes, possible spina bifida, severe left hydroureteronephrosis with left renal atrophy, recurrent UTI, and neurogenic bladder managed by CIC who presents today for evaluation of possible UTI.  She is scheduled for robotic left nephrectomy and partial ureterectomy with Dr. Thurmond Butts at [facility] on 08/16/2020.  Today she reports an approximate 1 week history of lower abdominal pain, low back pain, urinary leakage, and mild dysuria.  She has been CICing 4 times daily and is not reusing catheters.  In-office UA today positive for 3+ blood, 3+ protein, and 3+ leukocyte esterase; urine microscopy with >30 WBCs/HPF, 11-30 RBCs/HPF, granular casts, and many bacteria.   PMH: Past Medical History:  Diagnosis Date  . Diabetes mellitus without complication ([place])   . Recurrent UTI   . Self-catheterizes urinary bladder     Surgical History: Past Surgical History:  Procedure Laterality Date  . ABDOMINAL HYSTERECTOMY      Home Medications:  Allergies as of 07/05/2020      Reactions   Sulfa Antibiotics Hives   Nitrofurantoin Rash      Medication List       Accurate as of July 05, 2020  2:24 PM. If you have any questions, ask your nurse or doctor.        acetaminophen 500 MG tablet Commonly known as: TYLENOL Take 500-1,000 mg by mouth every 6 (six) hours as needed for mild pain or fever.   blood glucose meter kit and supplies Kit Dispense based on patient and insurance preference. Use up to four times daily as directed. (FOR ICD-9 250.00, 250.01).   cefUROXime 250 MG tablet Commonly known as: CEFTIN Take 1 tablet (250 mg total) by mouth 2 (two) times daily with a meal for 5 days. Started by: Debroah Loop, PA-C   glipiZIDE 5 MG tablet Commonly known as: GLUCOTROL Take 0.5 tablets (2.5 mg total) by mouth 2  (two) times daily before a meal.   ibuprofen 200 MG tablet Commonly known as: ADVIL Take 200-600 mg by mouth every 6 (six) hours as needed for fever or mild pain.   oxybutynin 5 MG tablet Commonly known as: DITROPAN Take by mouth.       Allergies:  Allergies  Allergen Reactions  . Sulfa Antibiotics Hives  . Nitrofurantoin Rash    Family History: Family History  Problem Relation Age of Onset  . Stroke Mother   . Heart attack Father   . Uterine cancer Sister     Social History:   reports that she has been smoking. She has been smoking about 0.50 packs per day. She has never used smokeless tobacco. She reports that she does not drink alcohol and does not use drugs.  Physical Exam: BP 131/87   Pulse (!) 101   Ht 5' (1.524 m)   Wt 150 lb (68 kg)   BMI 29.29 kg/m   Constitutional:  Alert and oriented, no acute distress, nontoxic appearing HEENT: [place], AT Cardiovascular: No clubbing, cyanosis, or edema Respiratory: Normal respiratory effort, no increased work of breathing Skin: No rashes, bruises or suspicious lesions Neurologic: Grossly intact, no focal deficits, moving all 4 extremities Psychiatric: Normal mood and affect  Laboratory Data: Results for orders placed or performed in visit on 07/05/20  Microscopic Examination   Urine  Result Value Ref Range   WBC,  UA >30 (A) 0 - 5 /hpf   RBC 11-30 (A) 0 - 2 /hpf   Epithelial Cells (non renal) 0-10 0 - 10 /hpf   Casts Present (A) None seen /lpf   Cast Type Granular casts (A) N/A   Bacteria, UA Many (A) None seen/Few  Urinalysis, Complete  Result Value Ref Range   Specific Gravity, UA 1.015 1.005 - 1.030   pH, UA 7.5 5.0 - 7.5   Color, UA Yellow Yellow   Appearance Ur Cloudy (A) Clear   Leukocytes,UA 3+ (A) Negative   Protein,UA 3+ (A) Negative/Trace   Glucose, UA Negative Negative   Ketones, UA Negative Negative   RBC, UA 3+ (A) Negative   Bilirubin, UA Negative Negative   Urobilinogen, Ur 0.2 0.2 - 1.0 mg/dL    Nitrite, UA Negative Negative   Microscopic Examination See below:    Assessment & Plan:   1. Recurrent UTI UA grossly infected today, will send for culture for further evaluation and start empiric cefuroxime.  Counseled patient that I would contact her if her urine culture results indicate a necessary change in therapy.  She expressed understanding. - Urinalysis, Complete - CULTURE, URINE COMPREHENSIVE - cefUROXime (CEFTIN) 250 MG tablet; Take 1 tablet (250 mg total) by mouth 2 (two) times daily with a meal for 5 days.  Dispense: 10 tablet; Refill: 0  2. Microscopic hematuria Likely secondary to #1 above.  Will defer repeat UA to prove resolution following culture appropriate antibiotics given her recent completion of hematuria work-up at [facility] with scheduled left nephrectomy next month.  Patient is in agreement with this plan.  Return if symptoms worsen or fail to improve.  Debroah Loop, PA-C  [facility] [address] [place], [place] [address] [phone]

## 2020-07-08 LAB — CULTURE, URINE COMPREHENSIVE

## 2020-07-11 ENCOUNTER — Telehealth: Payer: Self-pay | Admitting: Physician Assistant

## 2020-07-11 MED ORDER — DOXYCYCLINE HYCLATE 100 MG PO CAPS
100.0000 mg | ORAL_CAPSULE | Freq: Two times a day (BID) | ORAL | 0 refills | Status: AC
Start: 2020-07-11 — End: 2020-07-18

## 2020-07-11 NOTE — Telephone Encounter (Signed)
Please contact the patient and inform her that her recent urine culture has finalized.  The bacteria she is growing is resistant to the cefuroxime I prescribed.  I would like her to stop this medication and start doxycycline instead.  I have sent a prescription for doxycycline 100 mg twice daily x7 days to the [facility] in [place].

## 2020-07-11 NOTE — Telephone Encounter (Signed)
Notified patient as advised, patient verbalized understanding.  

## 2021-02-21 ENCOUNTER — Other Ambulatory Visit: Payer: Self-pay

## 2021-02-21 ENCOUNTER — Emergency Department
Admission: EM | Admit: 2021-02-21 | Discharge: 2021-02-21 | Disposition: A | Discharge status: Home / Self Care | Payer: BLUE CROSS/BLUE SHIELD | Attending: Emergency Medicine | Admitting: Emergency Medicine

## 2021-02-21 DIAGNOSIS — H5711 Ocular pain, right eye: Secondary | ICD-10-CM | POA: Diagnosis present

## 2021-02-21 DIAGNOSIS — T83511A Infection and inflammatory reaction due to indwelling urethral catheter, initial encounter: Secondary | ICD-10-CM | POA: Insufficient documentation

## 2021-02-21 DIAGNOSIS — H168 Other keratitis: Secondary | ICD-10-CM | POA: Insufficient documentation

## 2021-02-21 DIAGNOSIS — N390 Urinary tract infection, site not specified: Secondary | ICD-10-CM | POA: Diagnosis not present

## 2021-02-21 DIAGNOSIS — E119 Type 2 diabetes mellitus without complications: Secondary | ICD-10-CM | POA: Insufficient documentation

## 2021-02-21 DIAGNOSIS — F17200 Nicotine dependence, unspecified, uncomplicated: Secondary | ICD-10-CM | POA: Insufficient documentation

## 2021-02-21 DIAGNOSIS — Z7984 Long term (current) use of oral hypoglycemic drugs: Secondary | ICD-10-CM | POA: Diagnosis not present

## 2021-02-21 LAB — URINALYSIS, COMPLETE (UACMP) WITH MICROSCOPIC
Bilirubin Urine: NEGATIVE
Glucose, UA: NEGATIVE mg/dL
Hgb urine dipstick: NEGATIVE
Ketones, ur: NEGATIVE mg/dL
Nitrite: NEGATIVE
Protein, ur: NEGATIVE mg/dL
Specific Gravity, Urine: 1.009 (ref 1.005–1.030)
WBC, UA: >50 WBC/hpf — ABNORMAL HIGH (ref 0–5)
pH: 7 (ref 5.0–8.0)

## 2021-02-21 MED ORDER — NEOMYCIN-POLYMYXIN-DEXAMETH 3.5-10000-0.1 OP SUSP: 1.0000 [drp] | Freq: Four times a day (QID) | OPHTHALMIC | 0 refills | Status: AC

## 2021-02-21 MED ORDER — NEOMYCIN-POLYMYXIN-DEXAMETH 3.5-10000-0.1 OP SUSP
1.0000 [drp] | OPHTHALMIC | Status: DC
  Administered 2021-02-21: 1 [drp] via OPHTHALMIC
  Filled 2021-02-21: qty 5

## 2021-02-21 MED ORDER — FLUORESCEIN SODIUM 1 MG OP STRP
1.0000 | ORAL_STRIP | Freq: Once | OPHTHALMIC | Status: AC
  Administered 2021-02-21: 1 via OPHTHALMIC
  Filled 2021-02-21: qty 1

## 2021-02-21 MED ORDER — TETRACAINE HCL 0.5 % OP SOLN
2.0000 [drp] | Freq: Once | OPHTHALMIC | Status: AC
  Administered 2021-02-21: 2 [drp] via OPHTHALMIC
  Filled 2021-02-21: qty 4

## 2021-02-21 MED ORDER — FOSFOMYCIN TROMETHAMINE 3 G PO PACK
3.0000 g | PACK | Freq: Once | ORAL | Status: AC
  Administered 2021-02-21: 3 g via ORAL
  Filled 2021-02-21: qty 3

## 2021-02-21 NOTE — ED Triage Notes (Signed)
Pt come with c/o right eye pain and swelling. Pt states also foul odor, cloudy urine and pressure when cathing.

## 2021-02-21 NOTE — ED Notes (Signed)
See triage note.  Presents with right eye redness and irritation    Also having some dysuria and freq

## 2021-02-21 NOTE — Discharge Instructions (Addendum)
Please take the eyedrops as prescribed 4 times daily until follow-up with Dr. Brooke Dare.  His office will call you to arrange follow-up, however if you have not heard from his office tomorrow morning, please call their office at the number above.  You have also been given a one-time dose medication for UTI.  Please follow-up with your urologist as previously scheduled tomorrow.  Return to the emergency department if you experience any fever, chills or other systemic symptoms.

## 2021-02-21 NOTE — ED Provider Notes (Signed)
[facility] Emergency Department Provider Note  ____________________________________________   Event Date/Time   First MD Initiated Contact with Patient 02/21/21 1501     (approximate)  I have reviewed the triage vital signs and the nursing notes.   HISTORY  Chief Complaint Eye Pain and uti   HPI Susan Munoz is a 50 y.o. female who presents to the ER with multiple medical complaints.  First, patient is complaining of right a pain that began 2 days ago.  She states that she had an irritated portion on the lateral side of her eye that was painful, and she had been rubbing it.  The following day she developed pain in the eye.  She denies any blurred vision, no increased pain with movement of the eye.  She has no complaints of the left eye.  She does not wear contacts or glasses.  Patient also complains of suspected UTI.  She states that she has had many of these due to having to self cath for the last 25 to 30 years following a procedure.  She states that for the last 2 days she has had a foul smell to her urine with increasing abdominal pressure.  She denies any back pain, flank pain, fever, chills or other systemic symptoms.     Past Medical History:  Diagnosis Date  . Diabetes mellitus without complication ([place])   . Recurrent UTI   . Self-catheterizes urinary bladder     Patient Active Problem List   Diagnosis Date Noted  . UTI (urinary tract infection) 02/10/2020  . Hydronephrosis 02/10/2020  . Type 2 diabetes mellitus with other specified complication ([place]) [id number]/76/[id number]  . Dysuria     Past Surgical History:  Procedure Laterality Date  . ABDOMINAL HYSTERECTOMY      Prior to Admission medications   Medication Sig Start Date End Date Taking? Authorizing Provider  neomycin-polymyxin b-dexamethasone (MAXITROL) 3.5-10000-0.1 SUSP Place 1 drop into the right eye every 6 (six) hours for 7 days. 02/21/21 02/28/21 Yes Marlana Salvage, PA  acetaminophen  (TYLENOL) 500 MG tablet Take 500-1,000 mg by mouth every 6 (six) hours as needed for mild pain or fever.    [provider]  blood glucose meter kit and supplies KIT Dispense based on patient and insurance preference. Use up to four times daily as directed. (FOR ICD-9 250.00, 250.01). 02/11/20   Danford, Suann Larry, MD  glipiZIDE (GLUCOTROL) 5 MG tablet Take 0.5 tablets (2.5 mg total) by mouth 2 (two) times daily before a meal. 02/11/20 02/10/21  Danford, Suann Larry, MD  ibuprofen (ADVIL) 200 MG tablet Take 200-600 mg by mouth every 6 (six) hours as needed for fever or mild pain.    [provider]  oxybutynin (DITROPAN) 5 MG tablet Take by mouth. 04/20/20   [provider]    Allergies Sulfa antibiotics and Nitrofurantoin  Family History  Problem Relation Age of Onset  . Stroke Mother   . Heart attack Father   . Uterine cancer Sister     Social History Social History   Tobacco Use  . Smoking status: Current Every Day Smoker    Packs/day: 0.50  . Smokeless tobacco: Never Used  Vaping Use  . Vaping Use: Never used  Substance Use Topics  . Alcohol use: No  . Drug use: No    Review of Systems Constitutional: No fever/chills Eyes: + Right eye pain ENT: No sore throat. Cardiovascular: Denies chest pain. Respiratory: Denies shortness of breath. Gastrointestinal: +  abdominal pressure.  No nausea, no vomiting.  No diarrhea.  No constipation. Genitourinary: Negative for dysuria. Musculoskeletal: Negative for back pain. Skin: Negative for rash. Neurological: Negative for headaches, focal weakness or numbness.  ____________________________________________   PHYSICAL EXAM:  VITAL SIGNS: ED Triage Vitals  Enc Vitals Group     BP 02/21/21 1307 (!) 149/87     Pulse Rate 02/21/21 1307 91     Resp 02/21/21 1307 16     Temp 02/21/21 1307 97.8 F (36.6 C)     Temp Source 02/21/21 1307 Oral     SpO2 02/21/21 1307 97 %     Weight 02/21/21 1401 149 lb  14.6 oz (68 kg)     Height 02/21/21 1401 5' (1.524 m)     Head Circumference --      Peak Flow --      Pain Score 02/21/21 1308 5     Pain Loc --      Pain Edu? --      Excl. in [place]? --    Constitutional: Alert and oriented. Well appearing and in no acute distress. Eyes: Right eye conjunctive is erythematous with no purulent drainage.  There is a watery drainage.  There are 2 rounded hazy opacities in the superior portion of the iris that are visible to white light.  Fluorescein staining shows uptake of the 2 areas.  No foreign body identified.  See pictures below.  EOMs intact without pain.  PERRLA.  There is mild erythema and soft tissue swelling to the lateral eyelid, no open wounds or trauma identified. Head: Atraumatic. Nose: No congestion/rhinnorhea. Mouth/Throat: Mucous membranes are moist.  Oropharynx non-erythematous. Neck: No stridor.   Cardiovascular: Normal rate, regular rhythm. Grossly normal heart sounds.  Good peripheral circulation. Respiratory: Normal respiratory effort.  No retractions. Lungs CTAB. Gastrointestinal: Soft and nontender. No distention. No abdominal bruits. No CVA tenderness. Musculoskeletal: No lower extremity tenderness nor edema.  No joint effusions. Neurologic:  Normal speech and language. No gross focal neurologic deficits are appreciated. No gait instability. Skin:  Skin is warm, dry and intact. No rash noted. Psychiatric: Mood and affect are normal. Speech and behavior are normal.       ____________________________________________   LABS (all labs ordered are listed, but only abnormal results are displayed)  Labs Reviewed  URINALYSIS, COMPLETE (UACMP) WITH MICROSCOPIC - Abnormal; Notable for the following components:      Result Value   Color, Urine YELLOW (*)    APPearance CLOUDY (*)    Leukocytes,Ua LARGE (*)    WBC, UA >50 (*)    Bacteria, UA MANY (*)    All other components within normal limits  URINE CULTURE      ____________________________________________   INITIAL IMPRESSION / ASSESSMENT AND PLAN / ED COURSE  As part of my medical decision making, I reviewed the following data within the [facility] notes reviewed and incorporated, Labs reviewed, A consult was requested and obtained from this/these consultant(s) Opthalmology and Notes from prior ED visits        Patient is a 50 year old female who presents to the emergency department with multiple medical complaints.  See HPI.  In regards to her right eye, she does have an erythematous conjunctival injection as well as 2 opacities in the superior portion of the iris.  See pictures above.  This was discussed with Dr. Edison Pace of on-call ophthalmology who recommended Maxitrol eyedrops 4 times daily and close follow-up with his office in 1  to 2 days.  In regards to her urinary complaint, her urinalysis is consistent with UTI.  We will send this for culture.  In the interim, previous culture from November 21 was reviewed and demonstrates resistance to multiple antibiotics, and the patient has known allergies to Bactrim and nitrofurantoin.  Given this, discussed with attending Dr. Charna Archer who recommended one-time dose of fosfomycin with follow-up with her urologist to follow the culture and determine if any further antibiotics were needed at that time.  Discussed these findings with the patient, she has urology appointment already scheduled for tomorrow.  Return precautions were discussed and she stable this time for outpatient management.  Clinical Course as of 02/21/21 1632  Tue Feb 21, 2021  1604 Discussed the case with Dr. Benay Pillow of on call Ophthalmology. After review of the images in the patient's chart, will treat for bacterial keratitis with Neomycin/Polymixin/Dexa 4x per day. See. Dr. Edison Pace within 1 or 2 days.  [CR]    Clinical Course User Index [CR] Marlana Salvage, PA      ____________________________________________   FINAL CLINICAL IMPRESSION(S) / ED DIAGNOSES  Final diagnoses:  Bacterial keratitis  Urinary tract infection associated with catheterization of urinary tract, unspecified indwelling urinary catheter type, initial encounter [facility])     ED Discharge Orders         Ordered    neomycin-polymyxin b-dexamethasone (MAXITROL) 3.5-10000-0.1 SUSP  Every 6 hours        02/21/21 1614          *Please note:  SHIA DELAINE was evaluated in Emergency Department on 02/21/2021 for the symptoms described in the history of present illness. She was evaluated in the context of the global COVID-19 pandemic, which necessitated consideration that the patient might be at risk for infection with the SARS-CoV-2 virus that causes COVID-19. Institutional protocols and algorithms that pertain to the evaluation of patients at risk for COVID-19 are in a state of rapid change based on information released by regulatory bodies including the CDC and federal and state organizations. These policies and algorithms were followed during the patient's care in the ED.  Some ED evaluations and interventions may be delayed as a result of limited staffing during and the pandemic.*   Note:  This document was prepared using Dragon voice recognition software and may include unintentional dictation errors.   Marlana Salvage, PA 02/21/21 7096    Blake Divine, MD 02/21/21 [phone]

## 2021-02-24 LAB — URINE CULTURE: Culture: >=100000 — AB

## 2021-02-25 NOTE — Progress Notes (Signed)
ED Antimicrobial Stewardship Positive Culture Follow Up   Susan Munoz is an 50 y.o. female who presented to [facility] on 02/21/2021 with a chief complaint of  Chief Complaint  Patient presents with  . Eye Pain  . uti    Recent Results (from the past 720 hour(s))  Urine culture     Status: Abnormal   Collection Time: 02/21/21  1:06 PM   Specimen: Urine, Random  Result Value Ref Range Status   Specimen Description   Final    URINE, RANDOM Performed at [facility], [address] [place], [place] [address]    Special Requests   Final    NONE Performed at [facility], [address] [place], [place] [address]    Culture >=100,000 COLONIES/mL KLEBSIELLA PNEUMONIAE (A)  Final   Report Status 02/24/2021 FINAL  Final   Organism ID, Bacteria KLEBSIELLA PNEUMONIAE (A)  Final      Susceptibility   Klebsiella pneumoniae - MIC*    AMPICILLIN >=32 RESISTANT Resistant     CEFAZOLIN <=4 SENSITIVE Sensitive     CEFEPIME <=0.12 SENSITIVE Sensitive     CEFTRIAXONE <=0.25 SENSITIVE Sensitive     CIPROFLOXACIN <=0.25 SENSITIVE Sensitive     GENTAMICIN <=1 SENSITIVE Sensitive     IMIPENEM <=0.25 SENSITIVE Sensitive     NITROFURANTOIN 64 INTERMEDIATE Intermediate     TRIMETH/SULFA <=20 SENSITIVE Sensitive     AMPICILLIN/SULBACTAM 8 SENSITIVE Sensitive     PIP/TAZO <=4 SENSITIVE Sensitive     * >=100,000 COLONIES/mL KLEBSIELLA PNEUMONIAE    [x]  Patient discharged originally without antimicrobial agent and per discussion with MD treatment is now indicated.  New antibiotic prescription: Cephalexin 500mg  po q8h x 5 days  ED Provider: C.Jessup  02/25/21 1611 called patient at [phone]. Patient's preferred Pharmacy is [facility] on [address] [place] [phone].Rx called in to this pharmacy. Explained if symptoms do not improve or if any reaction develops to contact PCP or contact/come to ER   [name] A 02/25/2021, 4:10 PM Clinical Pharmacist

## 2022-01-02 IMAGING — CT CT RENAL STONE PROTOCOL
2 of 4 series · 15 of 46 positions shown, 17 images · non-contrast
Comparison: None.

CLINICAL DATA: Flank pain.

EXAM:
CT ABDOMEN AND PELVIS WITHOUT CONTRAST
TECHNIQUE: Multidetector CT imaging of the abdomen and pelvis was performed
following the standard protocol without IV contrast.

[Series 2: stone full standard · axial · 0.81mm/px · z∈[+59,+469]mm · 12 of 90 slices shown, 14 images]
[im 4/90  soft-tissue]
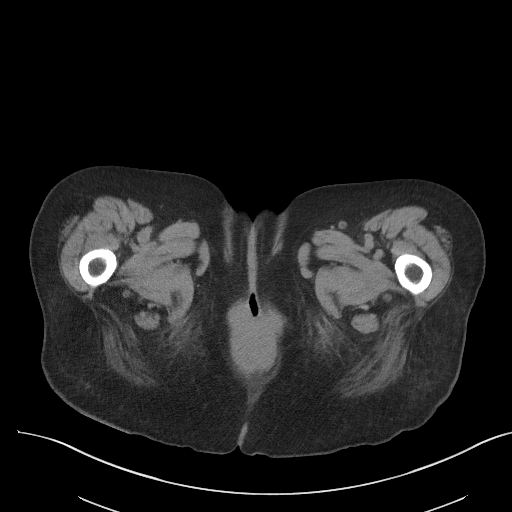
[im 4/90  bone]
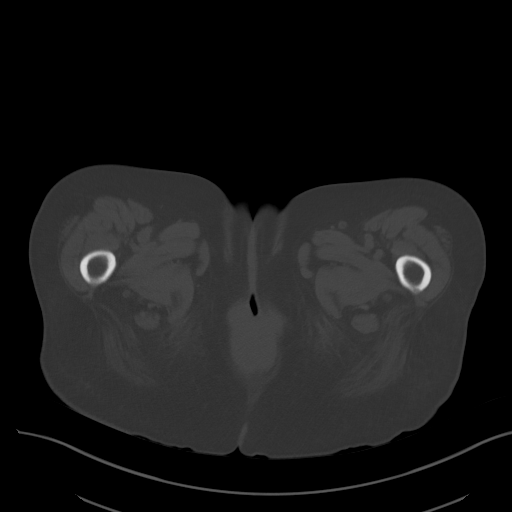
[im 12/90  soft-tissue]
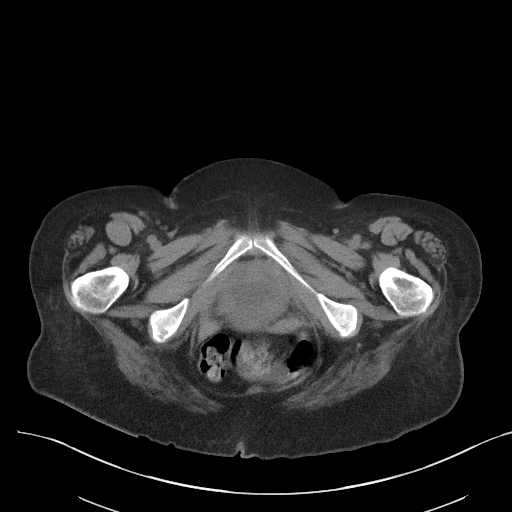
[im 19/90  soft-tissue]
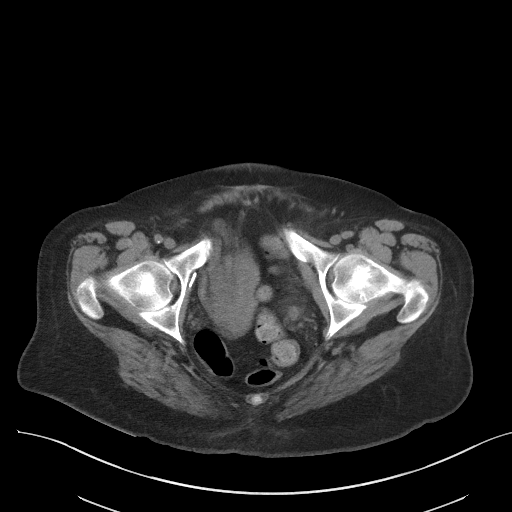
[im 26/90  soft-tissue]
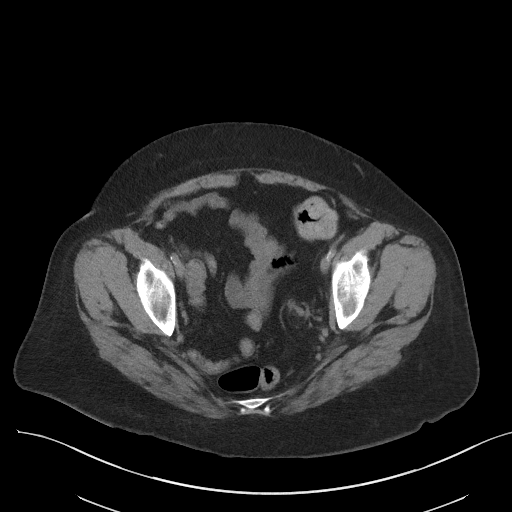
[im 34/90  soft-tissue]
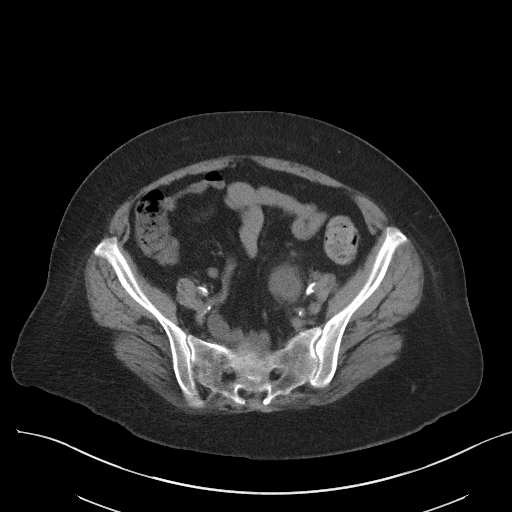
[im 41/90  soft-tissue]
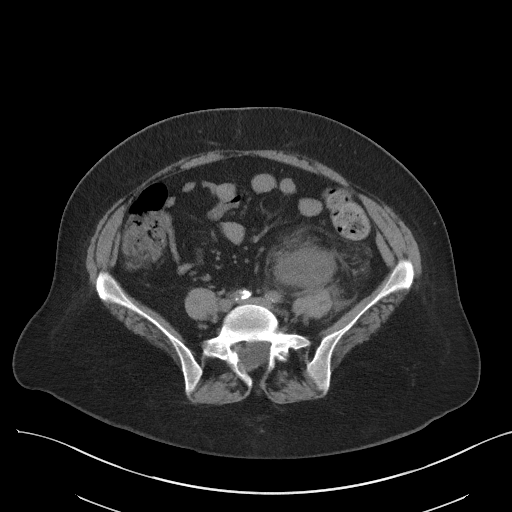
[im 49/90  soft-tissue]
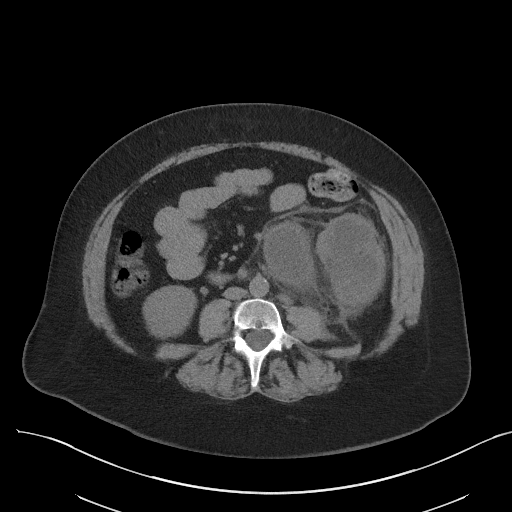
[im 56/90  soft-tissue]
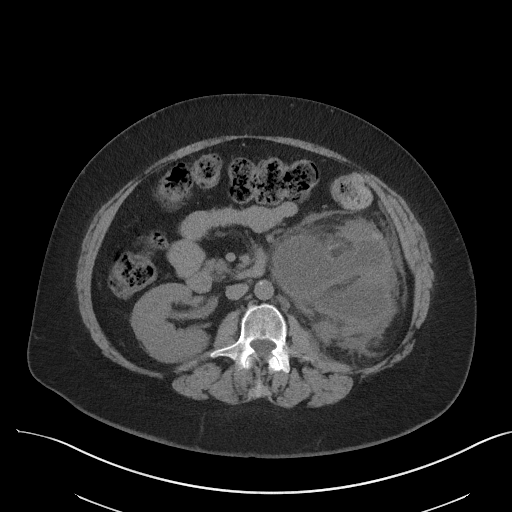
[im 64/90  soft-tissue]
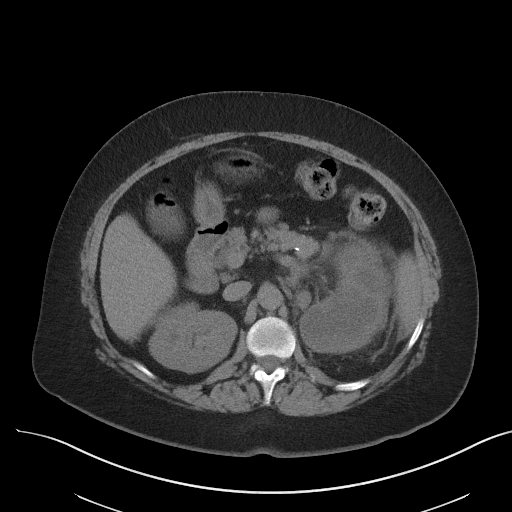
[im 64/90  bone]
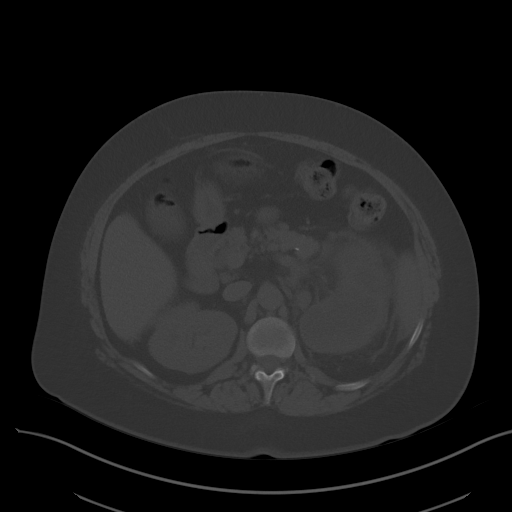
[im 71/90  soft-tissue]
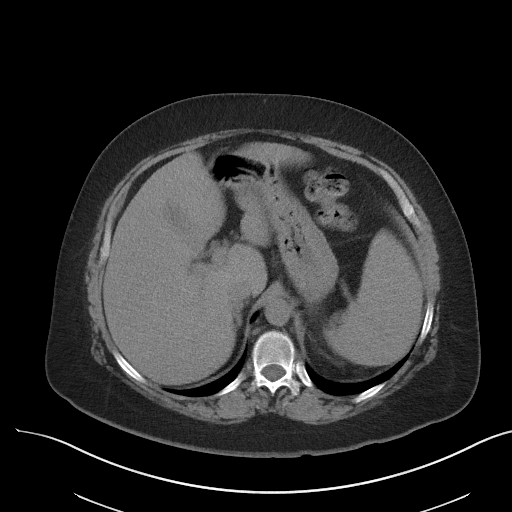
[im 78/90  soft-tissue]
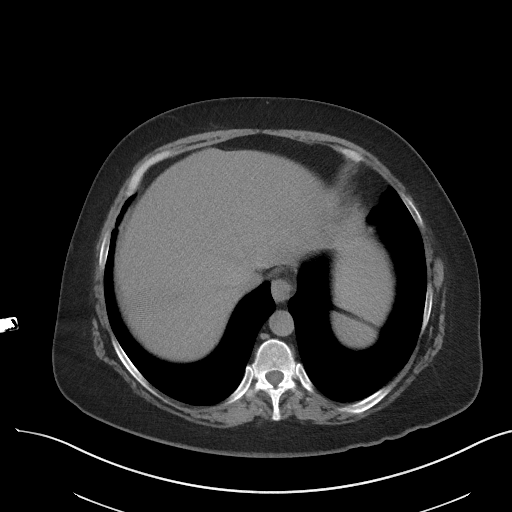
[im 86/90  soft-tissue]
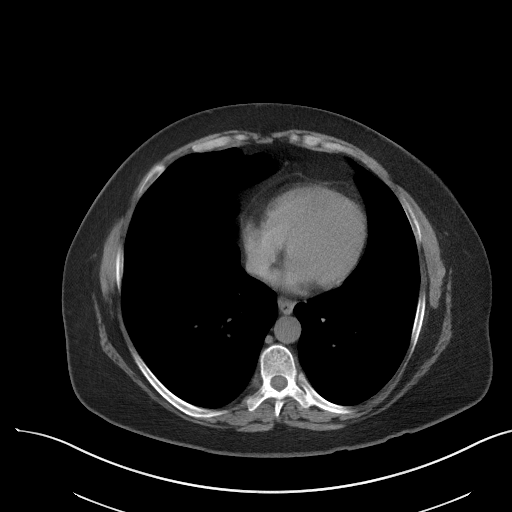

[Series 5: coronal · coronal · 0.77mm/px · 3 of 143 slices shown]
[im 48/143  soft-tissue]
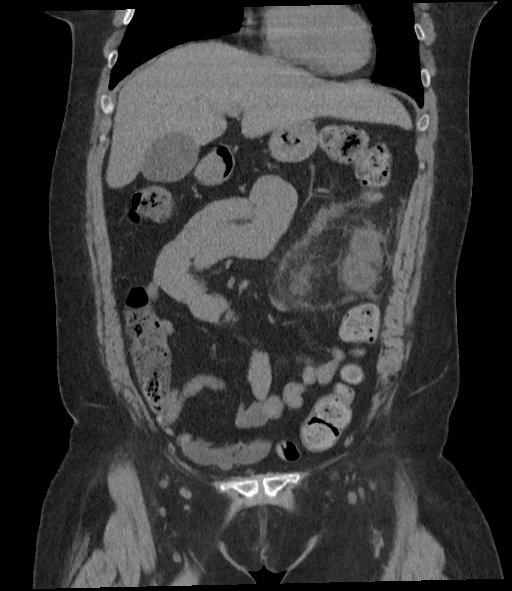
[im 64/143  soft-tissue]
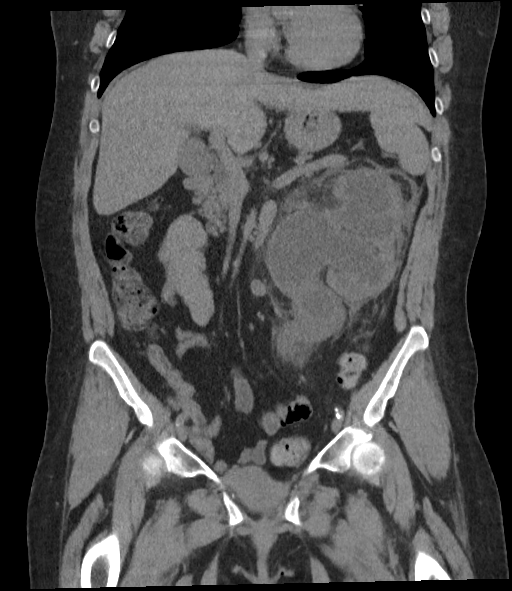
[im 79/143  soft-tissue]
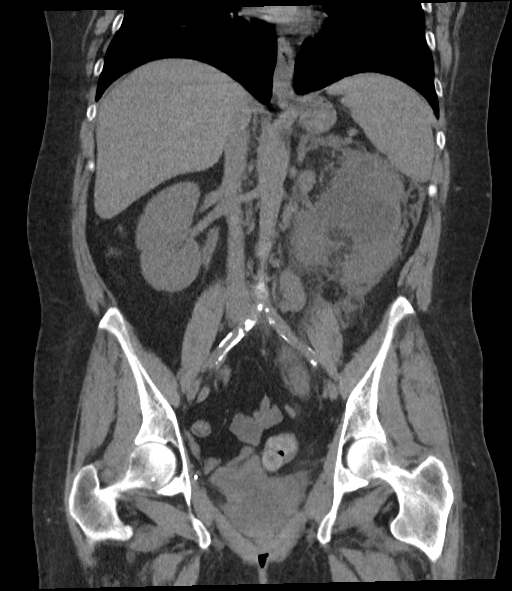

[15 of 46 positions shown; findings below may reference images not displayed]

FINDINGS: Lower chest: No acute abnormality.

Hepatobiliary: No focal liver abnormality is seen. No gallstones,
gallbladder wall thickening, or biliary dilatation.

Pancreas: The tail of the pancreas is not clearly identified. The
remaining visualized portions of the pancreas are unremarkable. No
pancreatic ductal dilatation or surrounding inflammatory changes.

Spleen: Normal in size without focal abnormality.

Adrenals/Urinary Tract: Adrenal glands are unremarkable. The right
kidney is normal in size, without renal calculi, focal lesion, or
hydronephrosis. There is marked severity left-sided hydronephrosis
with markedly dilated intrarenal calices. Diffuse renal cortical
thinning is seen. Marked severity hydronephrosis is seen along the
proximal [DATE] of the left ureter. Diffuse thickening of the ureteral
wall is noted. There is marked severity left-sided perinephric and
periureteral inflammatory fat stranding. No obstructing renal stones
are identified. The urinary bladder is partially contracted and
subsequently limited in evaluation. Mild to moderate severity
diffuse urinary bladder wall thickening is seen.

Stomach/Bowel: Stomach is within normal limits. Appendix appears
normal. No evidence of bowel wall thickening, distention, or
inflammatory changes.

Vascular/Lymphatic: There is mild aortic calcification. No enlarged
abdominal or pelvic lymph nodes.

Reproductive: Status post hysterectomy. No adnexal masses.

Other: Multiple small surgical clips are seen within the lateral
aspect of the lower pelvis on the right. No abdominal wall hernia or
abnormality. No abdominopelvic ascites.

Musculoskeletal: No acute or significant osseous findings.
IMPRESSION: 1. Marked severity left-sided hydronephrosis, hydroureter and
intrarenal calyceal dilatation with marked severity perinephric and
periureteral inflammatory fat stranding. Given the diffuse
thickening of the wall of the proximal to mid left ureter, an
obstructing ureteral mass cannot be excluded. Sequelae associated
with acute pyelonephritis should also be considered.
2. Mild to moderate severity diffuse urinary bladder wall
thickening. While this may be, in part, secondary to the contracted
nature of the urinary bladder, sequelae associated with mild
cystitis and/or an underlying neoplastic process cannot be excluded.

Aortic Atherosclerosis ([id number]).
